# Patient Record
Sex: Female | Born: 1995 | Hispanic: Yes | Marital: Married | Smoking: Never smoker
Health system: Southern US, Community
[De-identification: ages and names within clinical notes are randomized; demographics above are authoritative.]

## PROBLEM LIST (undated history)

## (undated) ENCOUNTER — Inpatient Hospital Stay: Payer: Self-pay

## (undated) DIAGNOSIS — F419 Anxiety disorder, unspecified: Secondary | ICD-10-CM

## (undated) HISTORY — PX: NO PAST SURGERIES: SHX2092

## (undated) HISTORY — DX: Anxiety disorder, unspecified: F41.9

---
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
## 2016-06-12 ENCOUNTER — Emergency Department
Admission: EM | Admit: 2016-06-12 | Discharge: 2016-06-13 | Disposition: A | Discharge status: Home / Self Care | Payer: Self-pay | Attending: Emergency Medicine | Admitting: Emergency Medicine

## 2016-06-12 ENCOUNTER — Encounter: Payer: Self-pay | Admitting: Emergency Medicine

## 2016-06-12 DIAGNOSIS — R109 Unspecified abdominal pain: Secondary | ICD-10-CM

## 2016-06-12 DIAGNOSIS — I880 Nonspecific mesenteric lymphadenitis: Secondary | ICD-10-CM | POA: Insufficient documentation

## 2016-06-12 LAB — CBC
HEMATOCRIT: 37.1 % (ref 35.0–47.0)
Hemoglobin: 12.7 g/dL (ref 12.0–16.0)
MCH: 28 pg (ref 26.0–34.0)
MCHC: 34.3 g/dL (ref 32.0–36.0)
MCV: 81.7 fL (ref 80.0–100.0)
PLATELETS: 283 10*3/uL (ref 150–440)
RBC: 4.54 MIL/uL (ref 3.80–5.20)
RDW: 14.2 % (ref 11.5–14.5)
WBC: 8 10*3/uL (ref 3.6–11.0)

## 2016-06-12 LAB — BASIC METABOLIC PANEL
Anion gap: 7 (ref 5–15)
BUN: 13 mg/dL (ref 6–20)
CO2: 26 mmol/L (ref 22–32)
Calcium: 8.9 mg/dL (ref 8.9–10.3)
Chloride: 106 mmol/L (ref 101–111)
Creatinine, Ser: 0.61 mg/dL (ref 0.44–1.00)
GFR calc Af Amer: >60 mL/min (ref >60)
GLUCOSE: 121 mg/dL — AB (ref 65–99)
POTASSIUM: 3.7 mmol/L (ref 3.5–5.1)
Sodium: 139 mmol/L (ref 135–145)

## 2016-06-12 NOTE — ED Triage Notes (Signed)
Patient ambulatory to triage with steady gait, without difficulty or distress noted; pt reports right flank/side pain x week with no accomp symptoms; denies hx of same

## 2016-06-13 ENCOUNTER — Emergency Department: Payer: Self-pay

## 2016-06-13 LAB — URINALYSIS COMPLETE WITH MICROSCOPIC (ARMC ONLY)
Bilirubin Urine: NEGATIVE
GLUCOSE, UA: NEGATIVE mg/dL
Ketones, ur: NEGATIVE mg/dL
LEUKOCYTES UA: NEGATIVE
NITRITE: NEGATIVE
PH: 6 (ref 5.0–8.0)
PROTEIN: NEGATIVE mg/dL
SPECIFIC GRAVITY, URINE: 1.023 (ref 1.005–1.030)

## 2016-06-13 LAB — PREGNANCY, URINE: PREG TEST UR: NEGATIVE

## 2016-06-13 NOTE — ED Notes (Signed)
[name] RN called the lab to confirm that they received the Pt's urine sample. Sample was received and a pregnancy test urine was added.

## 2016-06-13 NOTE — ED Provider Notes (Signed)
[facility] Emergency Department Provider Note   ____________________________________________    I have reviewed the triage vital signs and the nursing notes.   HISTORY  Chief Complaint Flank Pain     HPI Kristen Romero is a 20 y.o. female who presents with complaints of right flank pain. Patient reports she has had flank pain over the last 2 days although it has resolved just prior to arrival. She denies nausea or vomiting. She denies a history of kidney stones. She is being treated for vaginal bleeding by her gynecologist but reports that is improved. She denies dysuria. No fevers or chills.   History reviewed. No pertinent past medical history.  There are no active problems to display for this patient.   History reviewed. No pertinent surgical history.  Prior to Admission medications   Not on File     Allergies Patient has no known allergies.  No family history on file.  Social History Social History  Substance Use Topics  . Smoking status: Never Smoker  . Smokeless tobacco: Never Used  . Alcohol use No    Review of Systems  Constitutional: No fever/chills  Cardiovascular: Denies chest pain. Respiratory: Denies shortness of breath. Gastrointestinal: Mild aching flank pain now resolved Genitourinary: Negative for dysuria. Musculoskeletal: Negative for back pain. Skin: Negative for rash. Neurological: Negative for headaches  10-point ROS otherwise negative.  ____________________________________________   PHYSICAL EXAM:  VITAL SIGNS: ED Triage Vitals  Enc Vitals Group     BP 06/12/16 2225 131/83     Pulse Rate 06/12/16 2225 93     Resp 06/12/16 2225 18     Temp 06/12/16 2225 98.6 F (37 C)     Temp Source 06/12/16 2225 Oral     SpO2 06/12/16 2225 99 %     Weight 06/12/16 2223 180 lb (81.6 kg)     Height 06/12/16 2223 5\' 2"  (1.575 m)     Head Circumference --      Peak Flow --      Pain Score 06/12/16 2223 4   Pain Loc --      Pain Edu? --      Excl. in GC? --     Constitutional: Alert and oriented. No acute distress. Pleasant and interactive Eyes: Conjunctivae are normal.   Nose: No congestion/rhinnorhea. Mouth/Throat: Mucous membranes are moist.    Cardiovascular: Normal rate, regular rhythm. Grossly normal heart sounds.  Good peripheral circulation. Respiratory: Normal respiratory effort.  No retractions. Lungs CTAB. Gastrointestinal: Soft and nontender. No distention.  No CVA tenderness. Genitourinary: deferred Musculoskeletal: No lower extremity tenderness nor edema.  Warm and well perfused Neurologic:  Normal speech and language. No gross focal neurologic deficits are appreciated.  Skin:  Skin is warm, dry and intact. No rash noted. Psychiatric: Mood and affect are normal. Speech and behavior are normal.  ____________________________________________   LABS (all labs ordered are listed, but only abnormal results are displayed)  Labs Reviewed  URINALYSIS COMPLETEWITH MICROSCOPIC ([facility] ONLY) - Abnormal; Notable for the following:       Result Value   Color, Urine YELLOW (*)    APPearance CLEAR (*)    Hgb urine dipstick 2+ (*)    Bacteria, UA RARE (*)    Squamous Epithelial / LPF 0-5 (*)    All other components within normal limits  BASIC METABOLIC PANEL - Abnormal; Notable for the following:    Glucose, Bld 121 (*)    All other components within normal limits  CBC  PREGNANCY, URINE   ____________________________________________  EKG  None ____________________________________________  RADIOLOGY  CT renal stone study unremarkable ____________________________________________   PROCEDURES  Procedure(s) performed: No    Critical Care performed: No ____________________________________________   INITIAL IMPRESSION / ASSESSMENT AND PLAN / ED COURSE  Pertinent labs & imaging results that were available during my care of the patient were reviewed by me and  considered in my medical decision making (see chart for details).  Patient well-appearing and in no distress Pain has resolved without intervention. Her exam is benign. Urinalysis is reassuring, CT scan does not show renal stone, she may have mesenteric adenitis which could be the cause of her pain. Recommend supportive care. Return precautions discussed  Clinical Course    ____________________________________________   FINAL CLINICAL IMPRESSION(S) / ED DIAGNOSES  Final diagnoses:  Right flank pain  Mesenteric adenitis      NEW MEDICATIONS STARTED DURING THIS VISIT:  There are no discharge medications for this patient.    Note:  This document was prepared using Dragon voice recognition software and may include unintentional dictation errors.    Jene Everyobert Kendyl Bissonnette, MD 06/13/16 [phone]

## 2016-06-26 ENCOUNTER — Emergency Department
Admission: EM | Admit: 2016-06-26 | Discharge: 2016-06-27 | Disposition: A | Discharge status: Home / Self Care | Payer: Self-pay | Attending: Emergency Medicine | Admitting: Emergency Medicine

## 2016-06-26 ENCOUNTER — Encounter: Payer: Self-pay | Admitting: Emergency Medicine

## 2016-06-26 DIAGNOSIS — N938 Other specified abnormal uterine and vaginal bleeding: Secondary | ICD-10-CM | POA: Insufficient documentation

## 2016-06-26 DIAGNOSIS — N939 Abnormal uterine and vaginal bleeding, unspecified: Secondary | ICD-10-CM

## 2016-06-26 LAB — URINALYSIS COMPLETE WITH MICROSCOPIC (ARMC ONLY)
Bacteria, UA: NONE SEEN
Bilirubin Urine: NEGATIVE
Glucose, UA: NEGATIVE mg/dL
Hgb urine dipstick: NEGATIVE
KETONES UR: NEGATIVE mg/dL
Leukocytes, UA: NEGATIVE
NITRITE: NEGATIVE
PROTEIN: NEGATIVE mg/dL
Specific Gravity, Urine: 1.021 (ref 1.005–1.030)
pH: 7 (ref 5.0–8.0)

## 2016-06-26 LAB — POCT PREGNANCY, URINE: Preg Test, Ur: NEGATIVE

## 2016-06-26 NOTE — ED Triage Notes (Signed)
PT presents to ED with c/o vaginal spotting "for over a month"; light pink in color. Pt states she only notices the blood when wiping and no pad/tampon has been needed. Seen by pcp and told to take birth control to help with the bleeding but symptoms have not resolved. denies any other complaints at this time.

## 2016-06-27 ENCOUNTER — Emergency Department: Payer: Self-pay

## 2016-06-27 LAB — WET PREP, GENITAL
Clue Cells Wet Prep HPF POC: NONE SEEN
SPERM: NONE SEEN
TRICH WET PREP: NONE SEEN
YEAST WET PREP: NONE SEEN

## 2016-06-27 LAB — CHLAMYDIA/NGC RT PCR (ARMC ONLY)
Chlamydia Tr: NOT DETECTED
N GONORRHOEAE: NOT DETECTED

## 2016-06-27 NOTE — Discharge Instructions (Signed)
Please follow up with OB/GYN as you have scheduled next week Monday.

## 2016-06-27 NOTE — ED Notes (Signed)
Pt returned to ED Rm 25 from US at this time.

## 2016-06-27 NOTE — ED Provider Notes (Signed)
[facility] Emergency Department Provider Note   ____________________________________________   First MD Initiated Contact with Patient 06/26/16 2348     (approximate)  I have reviewed the triage vital signs and the nursing notes.   HISTORY  Chief Complaint Vaginal Bleeding    HPI Roxanna Hessie Dienerlejo is a 20 y.o. female who comes into the hospital today with vaginal bleeding for more than a month. She reports that it is not very heavy and she only sees that when she wipes. The patient reports that she saw her doctor and was given birth control about a month ago but she still continued spotting. She reports that when she spoke to her doctor again they didn't know what was going on and told her she needs to see a specialist. The patient reports that she has an appointment with a specialist next Monday but came in today because the bleeding seemed a little bit heavier. The patient reports that the bleeding is not so heavy that she needs to use pads or tampons but she states it concerned her. The patient's last normal period was 4 months ago and she has not had a normal period since then. She is unsure if there is something that she should be worried about so she decided to come into the hospital. She stopped taking her oral birth control 2 days ago. The patient has no abdominal pain currently. She is here for evaluation.   History reviewed. No pertinent past medical history.  There are no active problems to display for this patient.   History reviewed. No pertinent surgical history.  Prior to Admission medications   Not on File    Allergies Patient has no known allergies.  No family history on file.  Social History Social History  Substance Use Topics  . Smoking status: Never Smoker  . Smokeless tobacco: Never Used  . Alcohol use Yes    Review of Systems Constitutional: No fever/chills Eyes: No visual changes. ENT: No sore throat. Cardiovascular:  Denies chest pain. Respiratory: Denies shortness of breath. Gastrointestinal: No abdominal pain.  No nausea, no vomiting.  No diarrhea.  No constipation. Genitourinary: vaginal bleeding Musculoskeletal: Negative for back pain. Skin: Negative for rash. Neurological: Negative for headaches, focal weakness or numbness.  10-point ROS otherwise negative.  ____________________________________________   PHYSICAL EXAM:  VITAL SIGNS: ED Triage Vitals  Enc Vitals Group     BP 06/26/16 2106 122/73     Pulse Rate 06/26/16 2106 89     Resp 06/26/16 2106 18     Temp 06/26/16 2106 98.5 F (36.9 C)     Temp Source 06/26/16 2106 Oral     SpO2 06/26/16 2106 99 %     Weight 06/26/16 2107 180 lb (81.6 kg)     Height 06/26/16 2107 5\' 2"  (1.575 m)     Head Circumference --      Peak Flow --      Pain Score 06/26/16 2107 0     Pain Loc --      Pain Edu? --      Excl. in GC? --     Constitutional: Alert and oriented. Well appearing and in no acute distress. Eyes: Conjunctivae are normal. PERRL. EOMI. Head: Atraumatic. Nose: No congestion/rhinnorhea. Mouth/Throat: Mucous membranes are moist.  Oropharynx non-erythematous. Cardiovascular: Normal rate, regular rhythm. Grossly normal heart sounds.  Good peripheral circulation. Respiratory: Normal respiratory effort.  No retractions. Lungs CTAB. Gastrointestinal: Soft and nontender. No distention. Positive bowel sounds Genitourinary:  The patient has some mild bloody discharge that some mucus filled. Normal external genitalia. No cervical motion tenderness or uterine or adnexal tenderness. Cervix is closed. No discharge noted. Musculoskeletal: No lower extremity tenderness nor edema.   Neurologic:  Normal speech and language.  Skin:  Skin is warm, dry and intact.  Psychiatric: Mood and affect are normal.   ____________________________________________   LABS (all labs ordered are listed, but only abnormal results are displayed)  Labs Reviewed    WET PREP, GENITAL - Abnormal; Notable for the following:       Result Value   WBC, Wet Prep HPF POC MODERATE (*)    All other components within normal limits  URINALYSIS COMPLETEWITH MICROSCOPIC ([facility] ONLY) - Abnormal; Notable for the following:    Color, Urine YELLOW (*)    APPearance CLOUDY (*)    Squamous Epithelial / LPF 0-5 (*)    All other components within normal limits  CHLAMYDIA/NGC RT PCR ([facility] ONLY)  POC URINE PREG, ED  POCT PREGNANCY, URINE   ____________________________________________  EKG  none ____________________________________________  RADIOLOGY  Pelvic ultrasound ____________________________________________   PROCEDURES  Procedure(s) performed: None  Procedures  Critical Care performed: No  ____________________________________________   INITIAL IMPRESSION / ASSESSMENT AND PLAN / ED COURSE  Pertinent labs & imaging results that were available during my care of the patient were reviewed by me and considered in my medical decision making (see chart for details).  This is a 20 year old female who comes into the hospital today with a month of vaginal spotting. She is here today for evaluation the patient had a wet prep performed that was unremarkable. I will send the patient for an ultrasound and then reassess the patient.  Clinical Course as of Jun 27 209  Tue Jun 27, 2016  0207 1. Normal ultrasound appearance of the ovaries. 2. Endometrial stripe thickness of 14.5 mm but without focal abnormality. Correlate with phase of menses.   US Pelvis Complete [AW]    Clinical Course User Index [AW] Rebecka ApleyAllison P Webster, MD    The patient's ultrasound is unremarkable. She'll be discharged home to follow-up with OB/GYN as she has scheduled next week Monday. ____________________________________________   FINAL CLINICAL IMPRESSION(S) / ED DIAGNOSES  Final diagnoses:  Vaginal bleeding  Dysfunctional uterine bleeding      NEW  MEDICATIONS STARTED DURING THIS VISIT:  New Prescriptions   No medications on file     Note:  This document was prepared using Dragon voice recognition software and may include unintentional dictation errors.    Rebecka ApleyAllison P Webster, MD 06/27/16 [phone]

## 2017-10-07 ENCOUNTER — Other Ambulatory Visit: Payer: Self-pay

## 2017-10-07 ENCOUNTER — Emergency Department
Admission: EM | Admit: 2017-10-07 | Discharge: 2017-10-07 | Disposition: A | Discharge status: Home / Self Care | Payer: 59 | Attending: Emergency Medicine | Admitting: Emergency Medicine

## 2017-10-07 ENCOUNTER — Emergency Department: Payer: 59

## 2017-10-07 DIAGNOSIS — K5900 Constipation, unspecified: Secondary | ICD-10-CM | POA: Insufficient documentation

## 2017-10-07 LAB — POCT PREGNANCY, URINE: Preg Test, Ur: NEGATIVE

## 2017-10-07 NOTE — ED Notes (Addendum)
Pt c/o difficulty having a BM x3 weeks. Pt reports seeing her PCP 2 weeks ago and given Miralax. Pt reports it causing her to have diarrhea so she stopped taking it. Pt reports having to strain to have a BM. Her last BM was this am and states it was "soft". Pt denies any n/v.

## 2017-10-07 NOTE — ED Triage Notes (Signed)
Pt  States "having trouble with bowel movements." states last BM was this AM, states was soft. Saw PCP and was given stool softener. States not having them as often and having to strain. Alert, oriented, ambulatory. No distress noted.

## 2017-10-07 NOTE — ED Provider Notes (Signed)
[facility] Emergency Department Provider Note  ____________________________________________   First MD Initiated Contact with Patient 10/07/17 1150     (approximate)  I have reviewed the triage vital signs and the nursing notes.   HISTORY  Chief Complaint Constipation    HPI Kristen CourseDarleen Romero is a 22 y.o. female presents to the emergency department because she has been having trouble with her bowel movements for over 3 weeks.  She saw her PCP 2 weeks ago and was given MiraLAX.  This caused her to have diarrhea.  So she stopped taking the medication.  She states that her last BM was soft and had small pieces.  She states it does float on the top quite a bit.  She got on [employer] and got really concerned that she might have an appendicitis or blockage.  She denies fever or chills.  She denies complaints vomiting.  She denies any urinary tract symptoms.  She does not have a history of IBS.  History reviewed. No pertinent past medical history.  There are no active problems to display for this patient.   History reviewed. No pertinent surgical history.  Prior to Admission medications   Not on File    Allergies Patient has no known allergies.  History reviewed. No pertinent family history.  Social History Social History   Tobacco Use  . Smoking status: Never Smoker  . Smokeless tobacco: Never Used  Substance Use Topics  . Alcohol use: Yes  . Drug use: No    Review of Systems  Constitutional: No fever/chills Eyes: No visual changes. ENT: No sore throat. Respiratory: Denies cough Abdomen: Complains of constipation Genitourinary: Negative for dysuria. Musculoskeletal: Negative for back pain. Skin: Negative for rash.    ____________________________________________   PHYSICAL EXAM:  VITAL SIGNS: ED Triage Vitals [10/07/17 1109]  Enc Vitals Group     BP 92/71     Pulse Rate 69     Resp 16     Temp 98.5 F (36.9 C)     Temp Source Oral       SpO2 100 %     Weight 183 lb (83 kg)     Height 5\' 2"  (1.575 m)     Head Circumference      Peak Flow      Pain Score 3     Pain Loc      Pain Edu?      Excl. in GC?     Constitutional: Alert and oriented. Well appearing and in no acute distress. Eyes: Conjunctivae are normal.  Head: Atraumatic. Nose: No congestion/rhinnorhea. Mouth/Throat: Mucous membranes are moist.   Cardiovascular: Normal rate, regular rhythm.  Heart sounds are normal Respiratory: Normal respiratory effort.  No retractions lungs are clear to auscultation Abdomen: Soft, nontender, bowel sounds normal all 4 quads GU: deferred Musculoskeletal: FROM all extremities, warm and well perfused Neurologic:  Normal speech and language.  Skin:  Skin is warm, dry and intact. No rash noted. Psychiatric: Mood and affect are normal. Speech and behavior are normal.  ____________________________________________   LABS (all labs ordered are listed, but only abnormal results are displayed)  Labs Reviewed  POCT PREGNANCY, URINE   ____________________________________________   ____________________________________________  RADIOLOGY  KUB  ____________________________________________   PROCEDURES  Procedure(s) performed: No  Procedures    ____________________________________________   INITIAL IMPRESSION / ASSESSMENT AND PLAN / ED COURSE  Pertinent labs & imaging results that were available during my care of the patient were reviewed by me  and considered in my medical decision making (see chart for details).  Patient is 22 year old female complaining of constipation.  She was seen by her primary care doctor 2 weeks ago.  She states she gave her MiraLAX and then she had diarrhea from MiraLAX.  Stop taking this medication and now has more problems with constipation.  She got on [employer] and is concerned that she has a blockage or appendicitis.  On physical exam she appears well and the abdomen is nontender  with good bowel sounds  KUB x-ray   KUB x-ray was normal.  Discussed x-ray results with patient.  She is to adjust her diet and lower the dose of the MiraLAX if it gives her diarrhea.  Explained to her that I think she is being a little too focused on her bowel habits at this time.  That she should relax and that everything will return to normal.  If she is worsening she should return the emergency department or see her regular doctor.  She states she understands will follow-up.  She was discharged in stable condition  As part of my medical decision making, I reviewed the following data within the electronic MEDICAL RECORD NUMBER Nursing notes reviewed and incorporated, Radiograph reviewed KUB x-ray is normal, Notes from prior ED visits and [place] Controlled Substance Database  ____________________________________________   FINAL CLINICAL IMPRESSION(S) / ED DIAGNOSES  Final diagnoses:  Constipation, unspecified constipation type      NEW MEDICATIONS STARTED DURING THIS VISIT:  There are no discharge medications for this patient.    Note:  This document was prepared using Dragon voice recognition software and may include unintentional dictation errors.    Faythe Ghee, PA-C 10/07/17 1552    Governor Rooks, MD 10/10/17 [phone]

## 2017-10-07 NOTE — Discharge Instructions (Signed)
Return to your normal diet.  Decrease the amount of MiraLAX.  Continue to eat fiber.  Drink plenty of water.  If you are worsening please return to the emergency department or your regular doctor.

## 2018-04-28 IMAGING — US US TRANSVAGINAL NON-OB
1 series · 14 of 25 positions shown · non-contrast
Comparison: None

CLINICAL DATA: Light vaginal spotting for 1 month irregular periods

EXAM:
TRANSABDOMINAL AND TRANSVAGINAL ULTRASOUND OF PELVIS
TECHNIQUE: Both transabdominal and transvaginal ultrasound examinations of the
pelvis were performed. Transabdominal technique was performed for
global imaging of the pelvis including uterus, ovaries, adnexal
regions, and pelvic cul-de-sac. It was necessary to proceed with
endovaginal exam following the transabdominal exam to visualize the
ovaries and endometrium.

[Series 1: us transvaginal non-ob · 0.25mm/px · 14 of 68 slices shown]
[im 1/68]
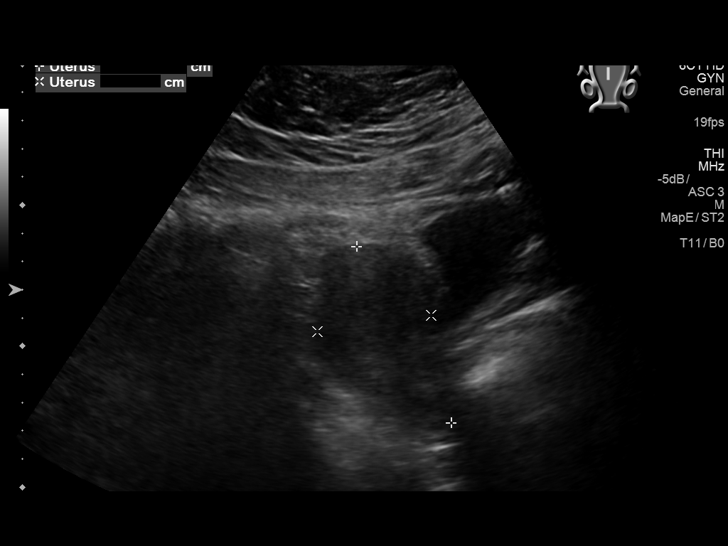
[im 6/68]
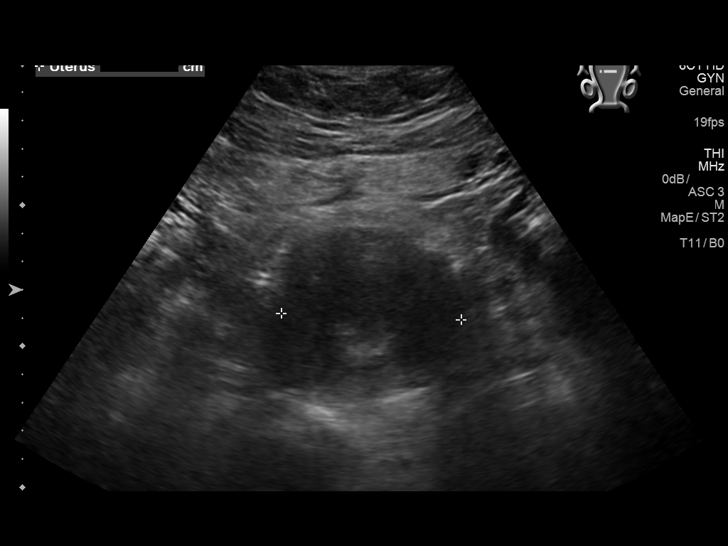
[im 12/68]
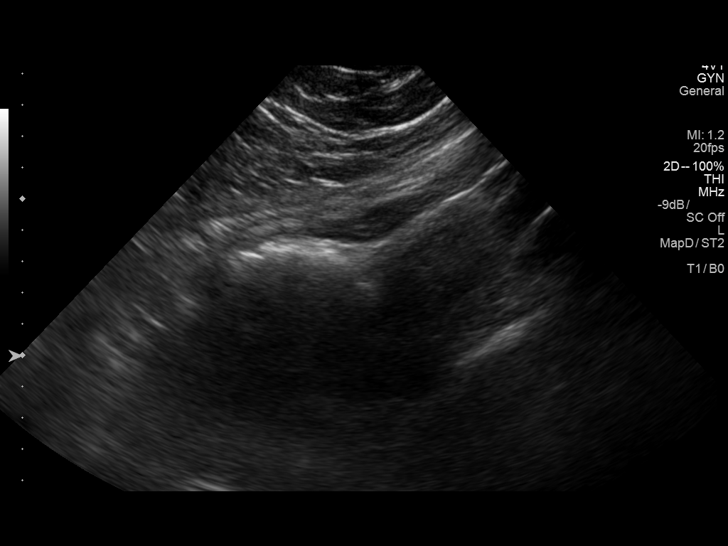
[im 17/68]
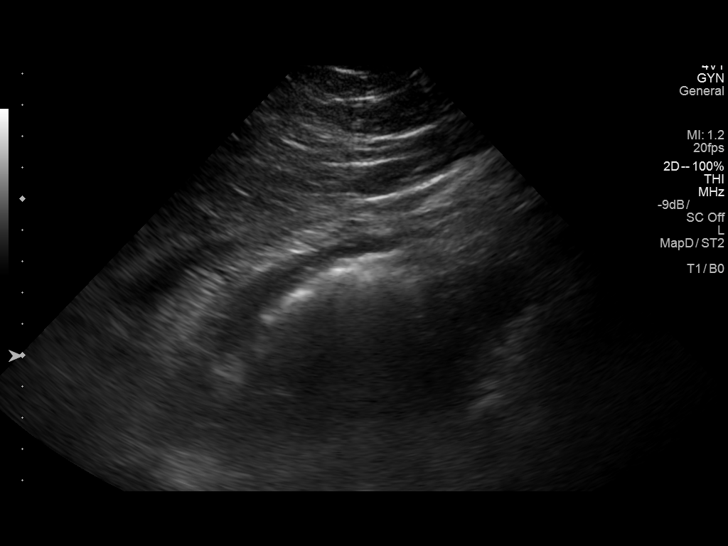
[im 23/68]
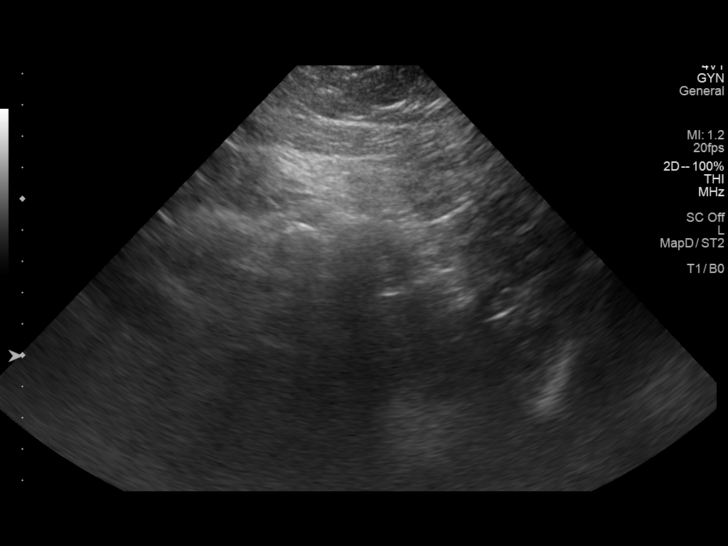
[im 26/68]
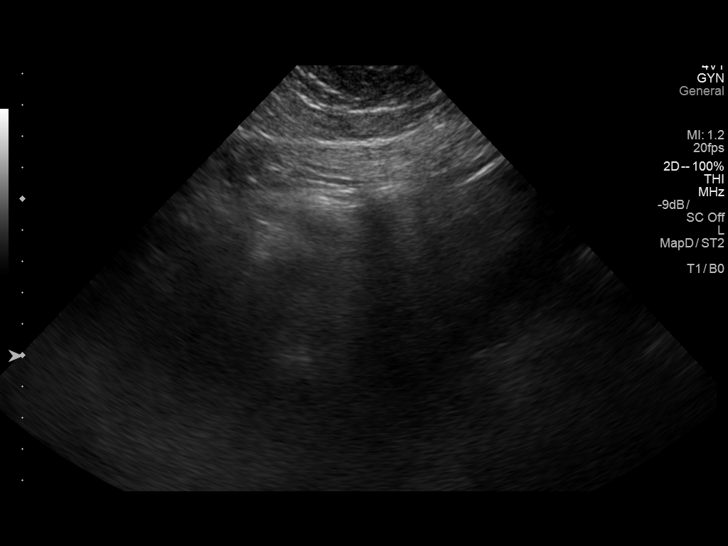
[im 31/68]
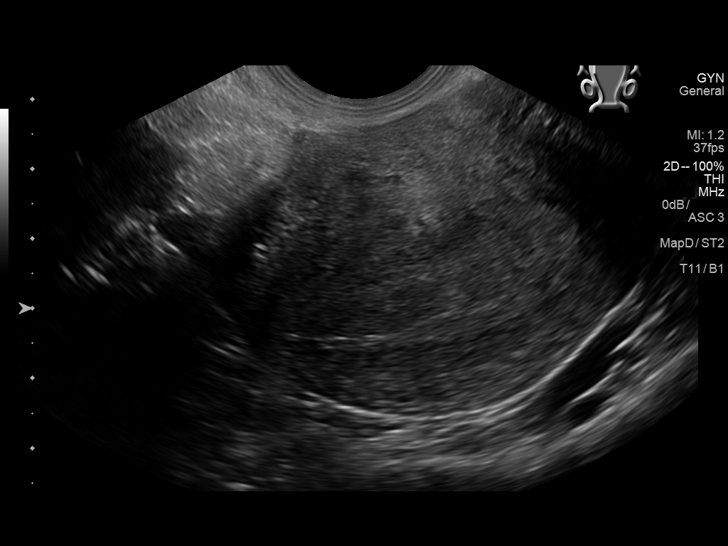
[im 37/68]
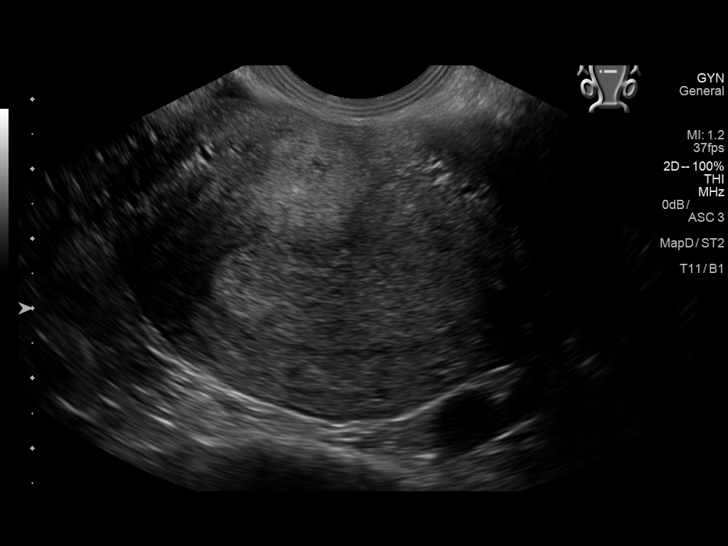
[im 42/68]
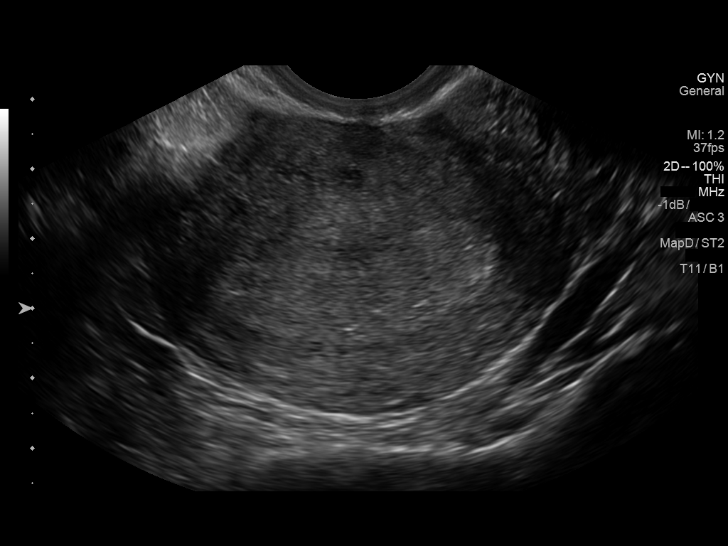
[im 45/68]
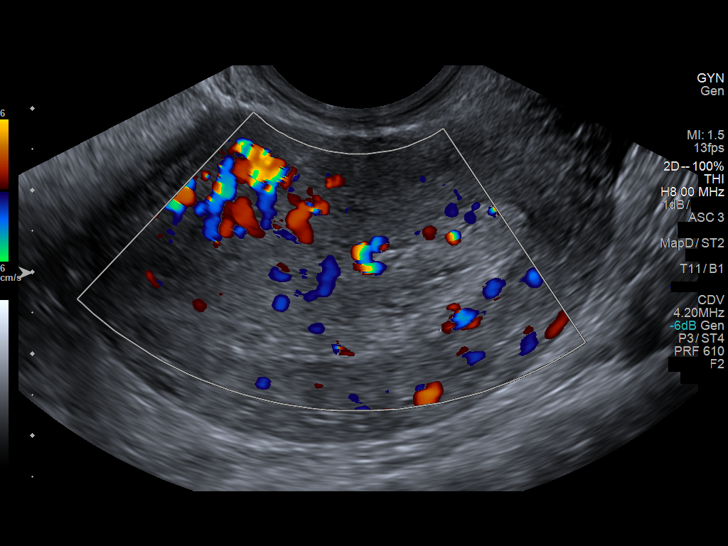
[im 51/68]
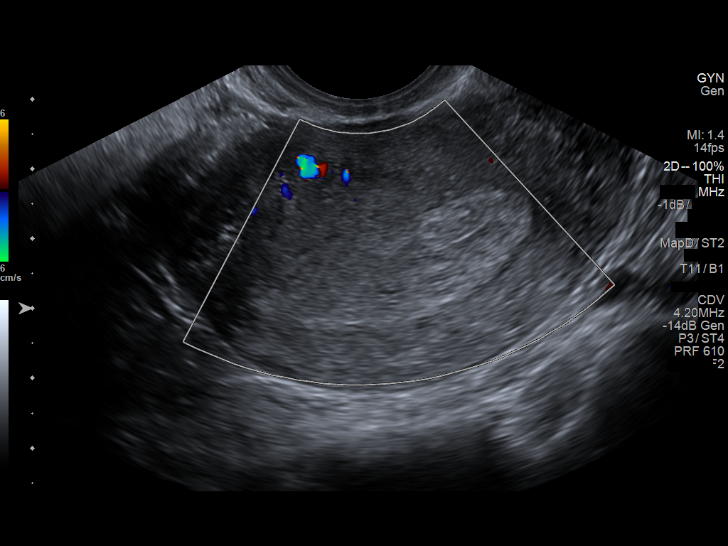
[im 56/68]
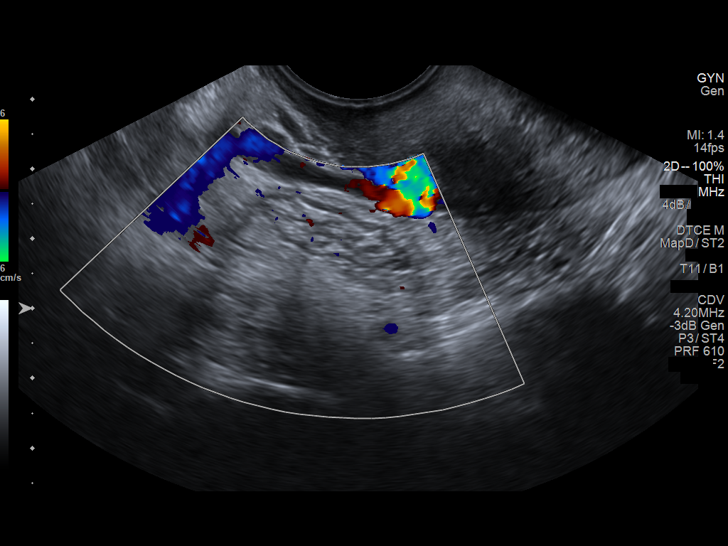
[im 62/68]
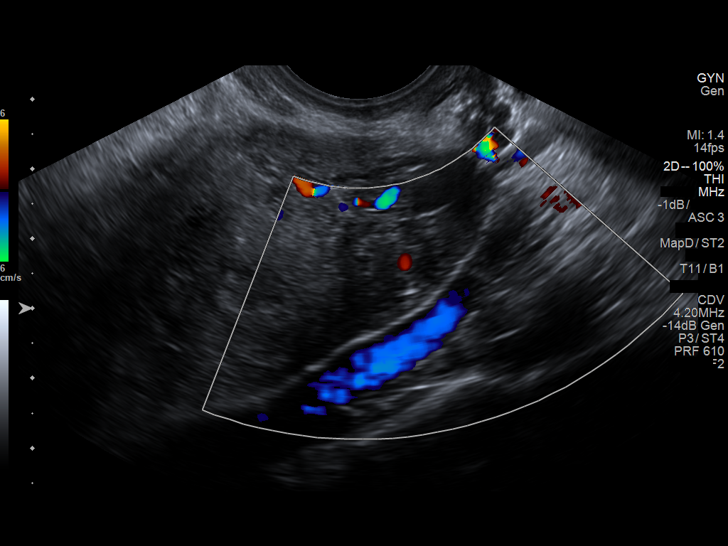
[im 68/68]
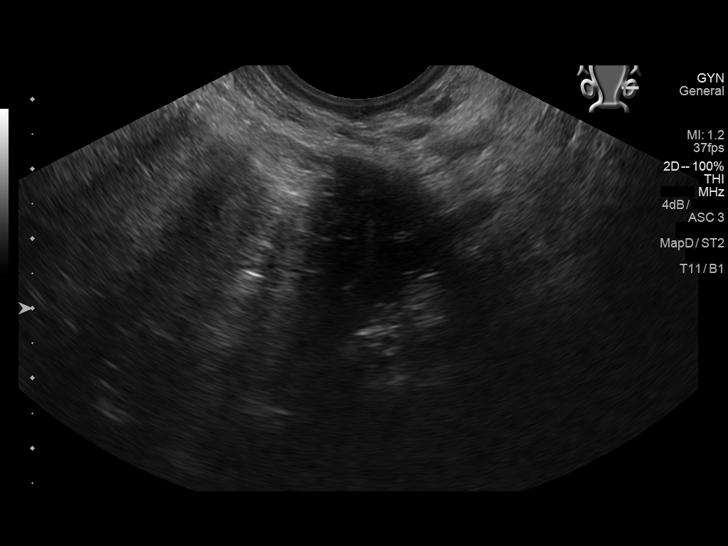

[14 of 25 positions shown; findings below may reference images not displayed]

FINDINGS: Uterus

Measurements: 6.6 x 4.2 x 5.8 cm. No fibroids or other mass
visualized.

Endometrium

Thickness: 14.5 mm.  No focal abnormality visualized.

Right ovary

Measurements: 2.7 x 1.5 x 2.1 cm. Normal appearance/no adnexal mass.

Left ovary

Measurements: 2.8 x 1.8 x 2.1 cm. Normal appearance/no adnexal mass.

Other findings

No abnormal free fluid.
IMPRESSION: 1. Normal ultrasound appearance of the ovaries.
2. Endometrial stripe thickness of 14.5 mm but without focal
abnormality. Correlate with phase of menses.

## 2018-07-29 IMAGING — CT CT RENAL STONE PROTOCOL
2 of 4 series · 17 of 46 positions shown, 19 images · non-contrast
Comparison: None.

CLINICAL DATA: Right flank pain for 1 week.

EXAM:
CT ABDOMEN AND PELVIS WITHOUT CONTRAST
TECHNIQUE: Multidetector CT imaging of the abdomen and pelvis was performed
following the standard protocol without IV contrast.

[Series 2: axial st · axial · 0.93mm/px · z∈[-370,+100]mm · 14 of 104 slices shown, 16 images]
[im 5/104  soft-tissue]
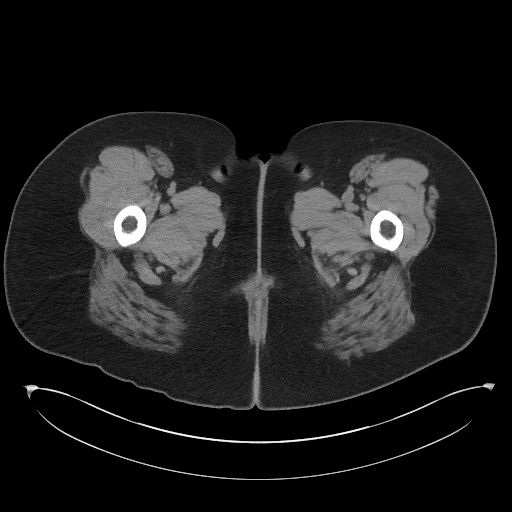
[im 5/104  bone]
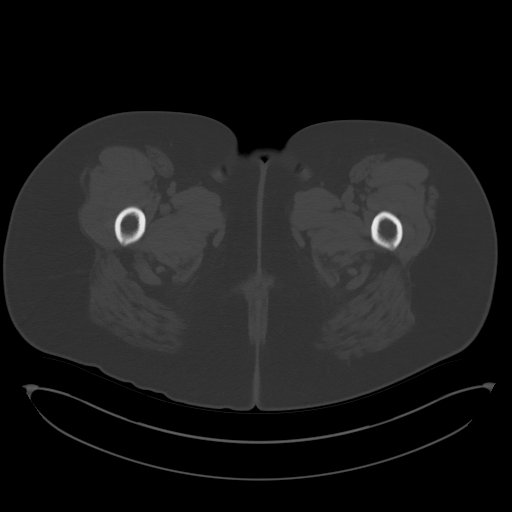
[im 13/104  soft-tissue]
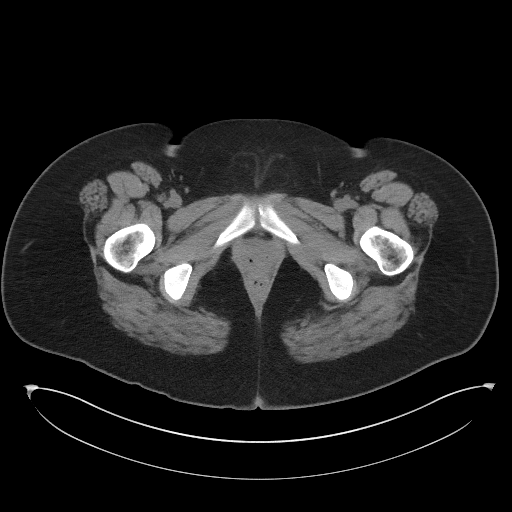
[im 22/104  soft-tissue]
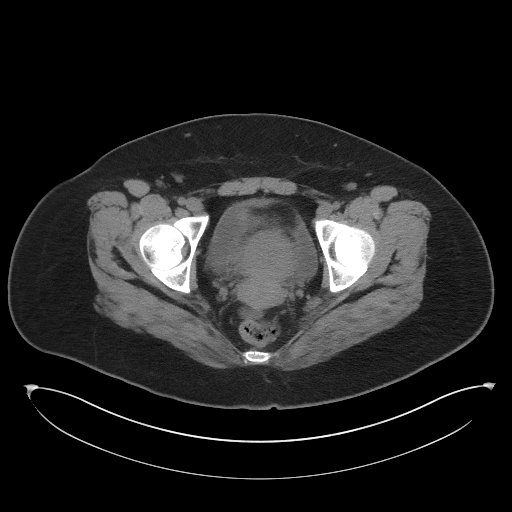
[im 26/104  soft-tissue]
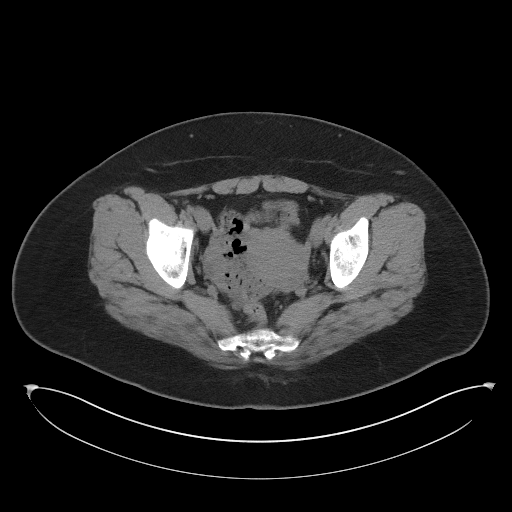
[im 35/104  soft-tissue]
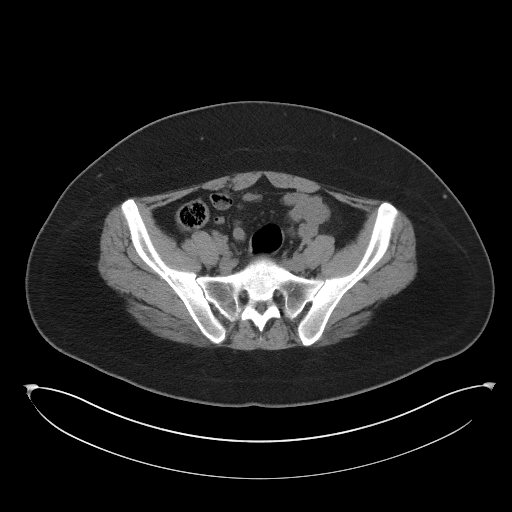
[im 43/104  soft-tissue]
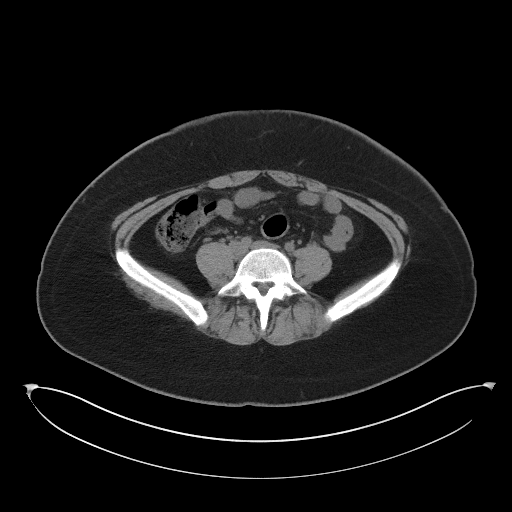
[im 48/104  soft-tissue]
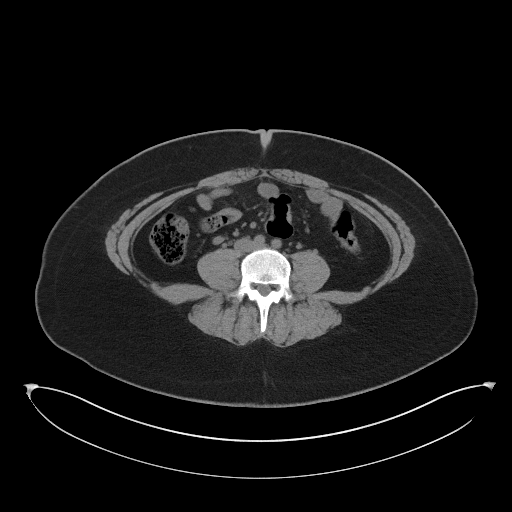
[im 56/104  soft-tissue]
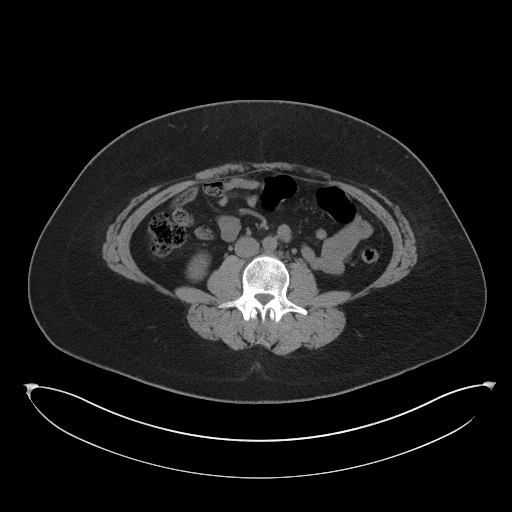
[im 61/104  soft-tissue]
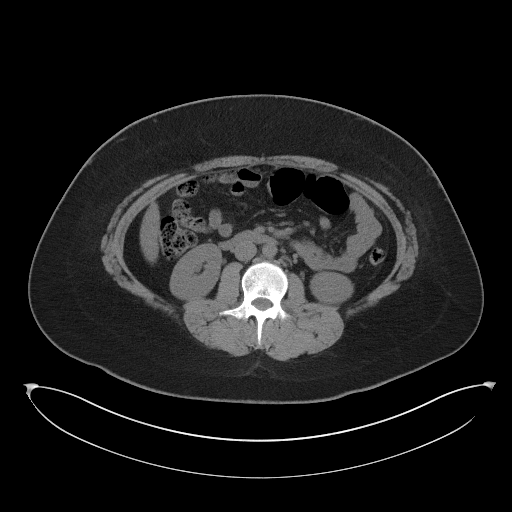
[im 61/104  bone]
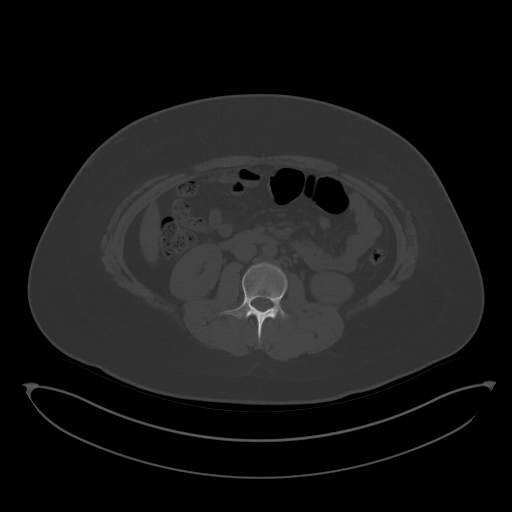
[im 69/104  soft-tissue]
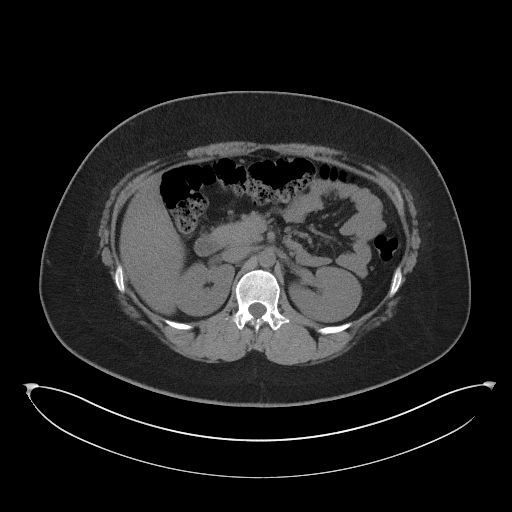
[im 78/104  soft-tissue]
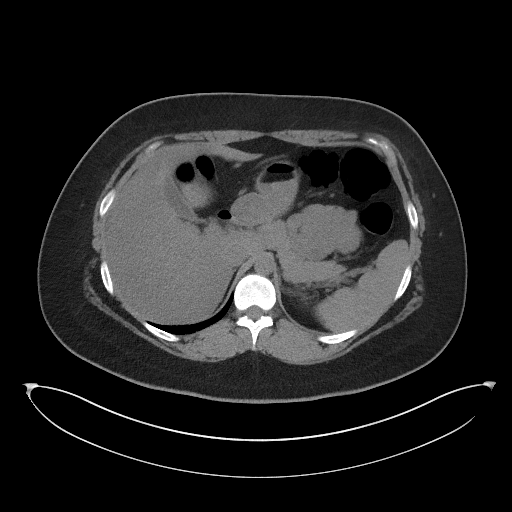
[im 82/104  soft-tissue]
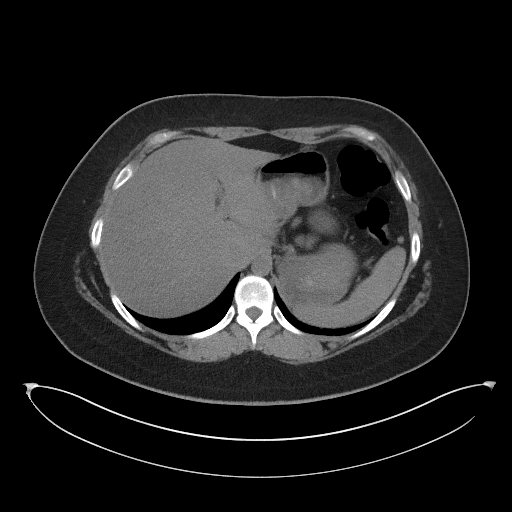
[im 91/104  soft-tissue]
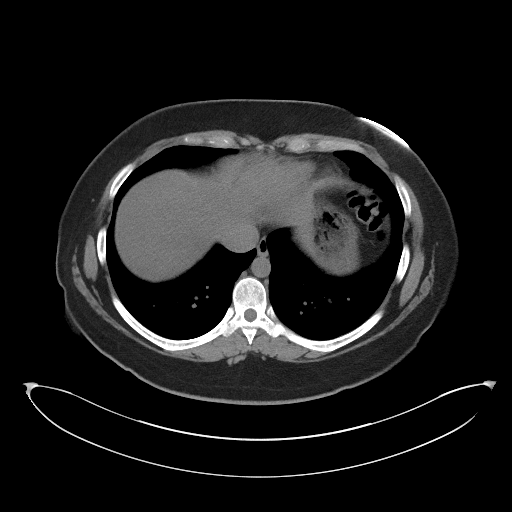
[im 99/104  soft-tissue]
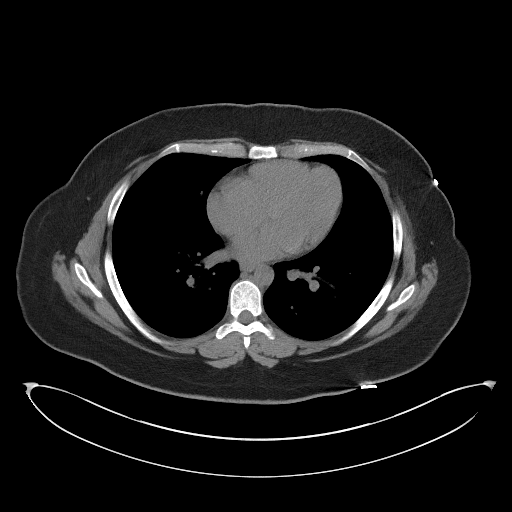

[Series 5: coronal · coronal · 0.91mm/px · 3 of 144 slices shown]
[im 48/144  soft-tissue]
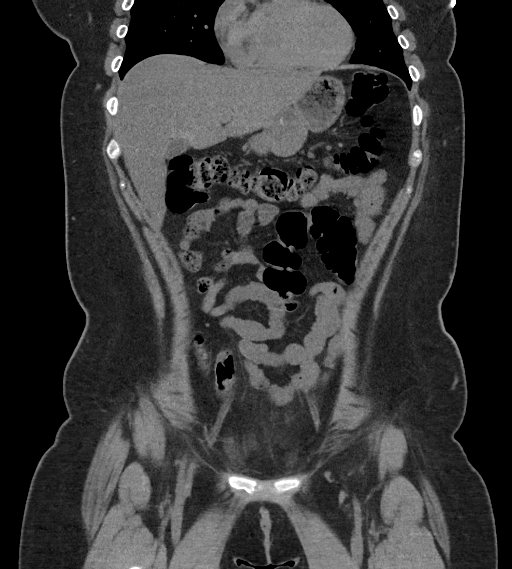
[im 64/144  soft-tissue]
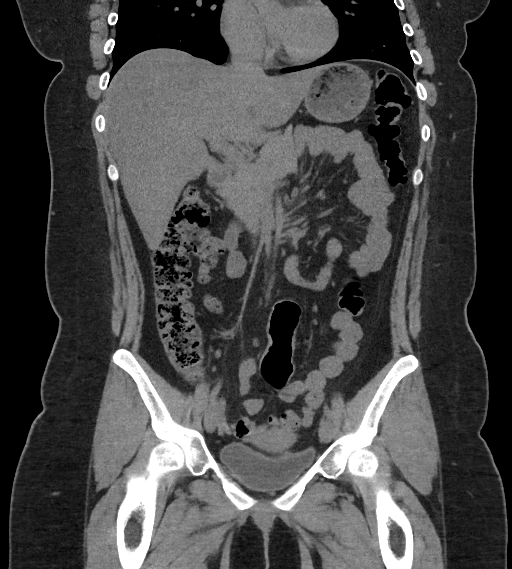
[im 80/144  soft-tissue]
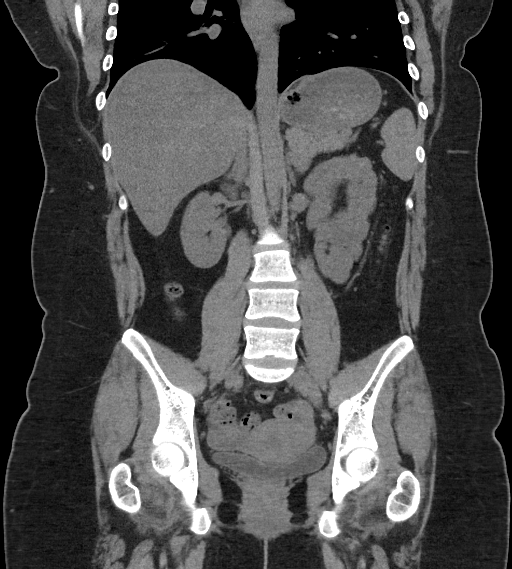

[17 of 46 positions shown; findings below may reference images not displayed]

FINDINGS: Lower chest: No acute abnormality.

Hepatobiliary: No focal liver abnormality is seen. No gallstones,
gallbladder wall thickening, or biliary dilatation.

Pancreas: Unremarkable. No pancreatic ductal dilatation or
surrounding inflammatory changes.

Spleen: Normal in size without focal abnormality.

Adrenals/Urinary Tract: Adrenal glands are unremarkable. Kidneys are
normal, without renal calculi, focal lesion, or hydronephrosis.
Bladder is unremarkable.

Stomach/Bowel: Stomach is within normal limits. Appendix appears
normal. No evidence of bowel wall thickening, distention, or
inflammatory changes.

Vascular/Lymphatic: The abdominal aorta is normal in caliber,
without calcification.

There are mildly prominent right lower quadrant mesenteric nodes.

Reproductive: Uterus and bilateral adnexa are unremarkable.

Other: No acute inflammatory changes are evident in the abdomen or
pelvis. There is no ascites.

Musculoskeletal: No acute or significant osseous findings.
IMPRESSION: 1. No urinary calculi.
2. Normal appendix
3. Mildly prominent right lower quadrant mesenteric nodes. This
could represent mesenteric adenitis.

## 2019-11-22 IMAGING — CR DG ABDOMEN 1V
1 series · 2 of 2 positions shown · non-contrast
Comparison: None.

CLINICAL DATA: Abdominal pain, constipation

EXAM:
ABDOMEN - 1 VIEW

[Series 1: dg abd 1 view · 0.14mm/px · 2 of 2 slices shown]
[im 1/2]
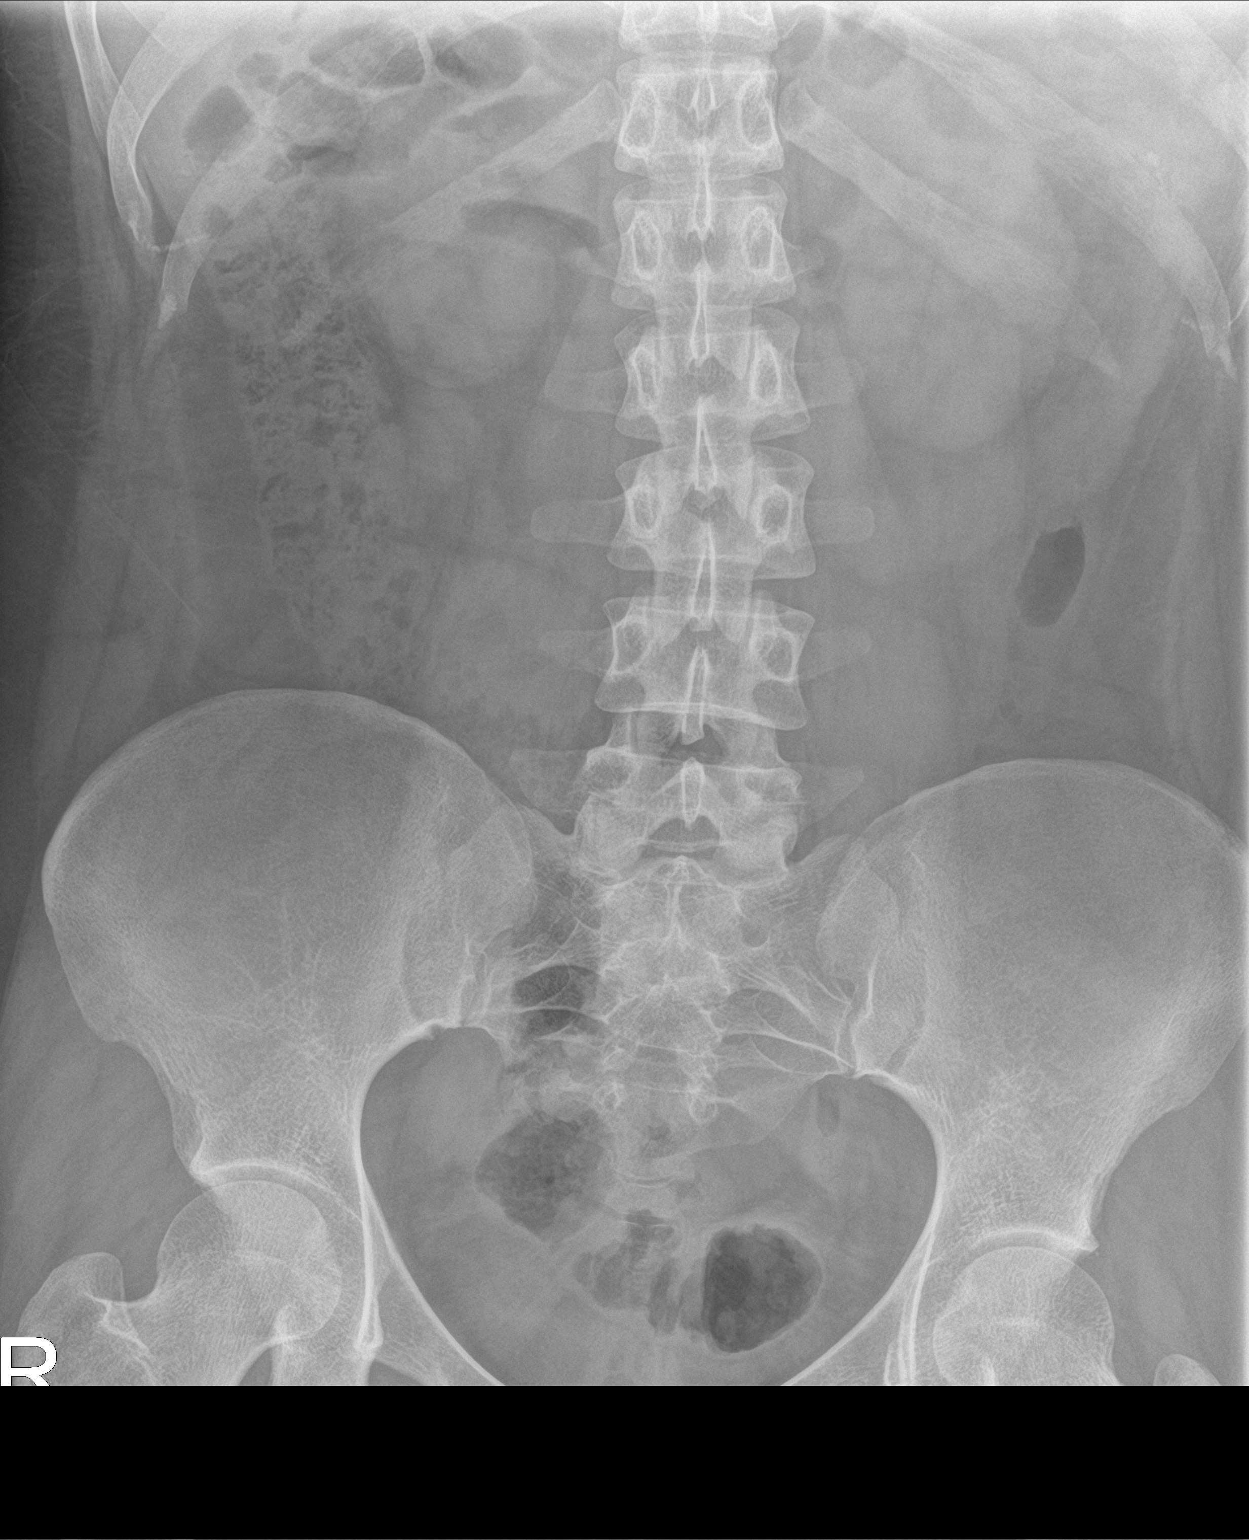
[im 2/2]
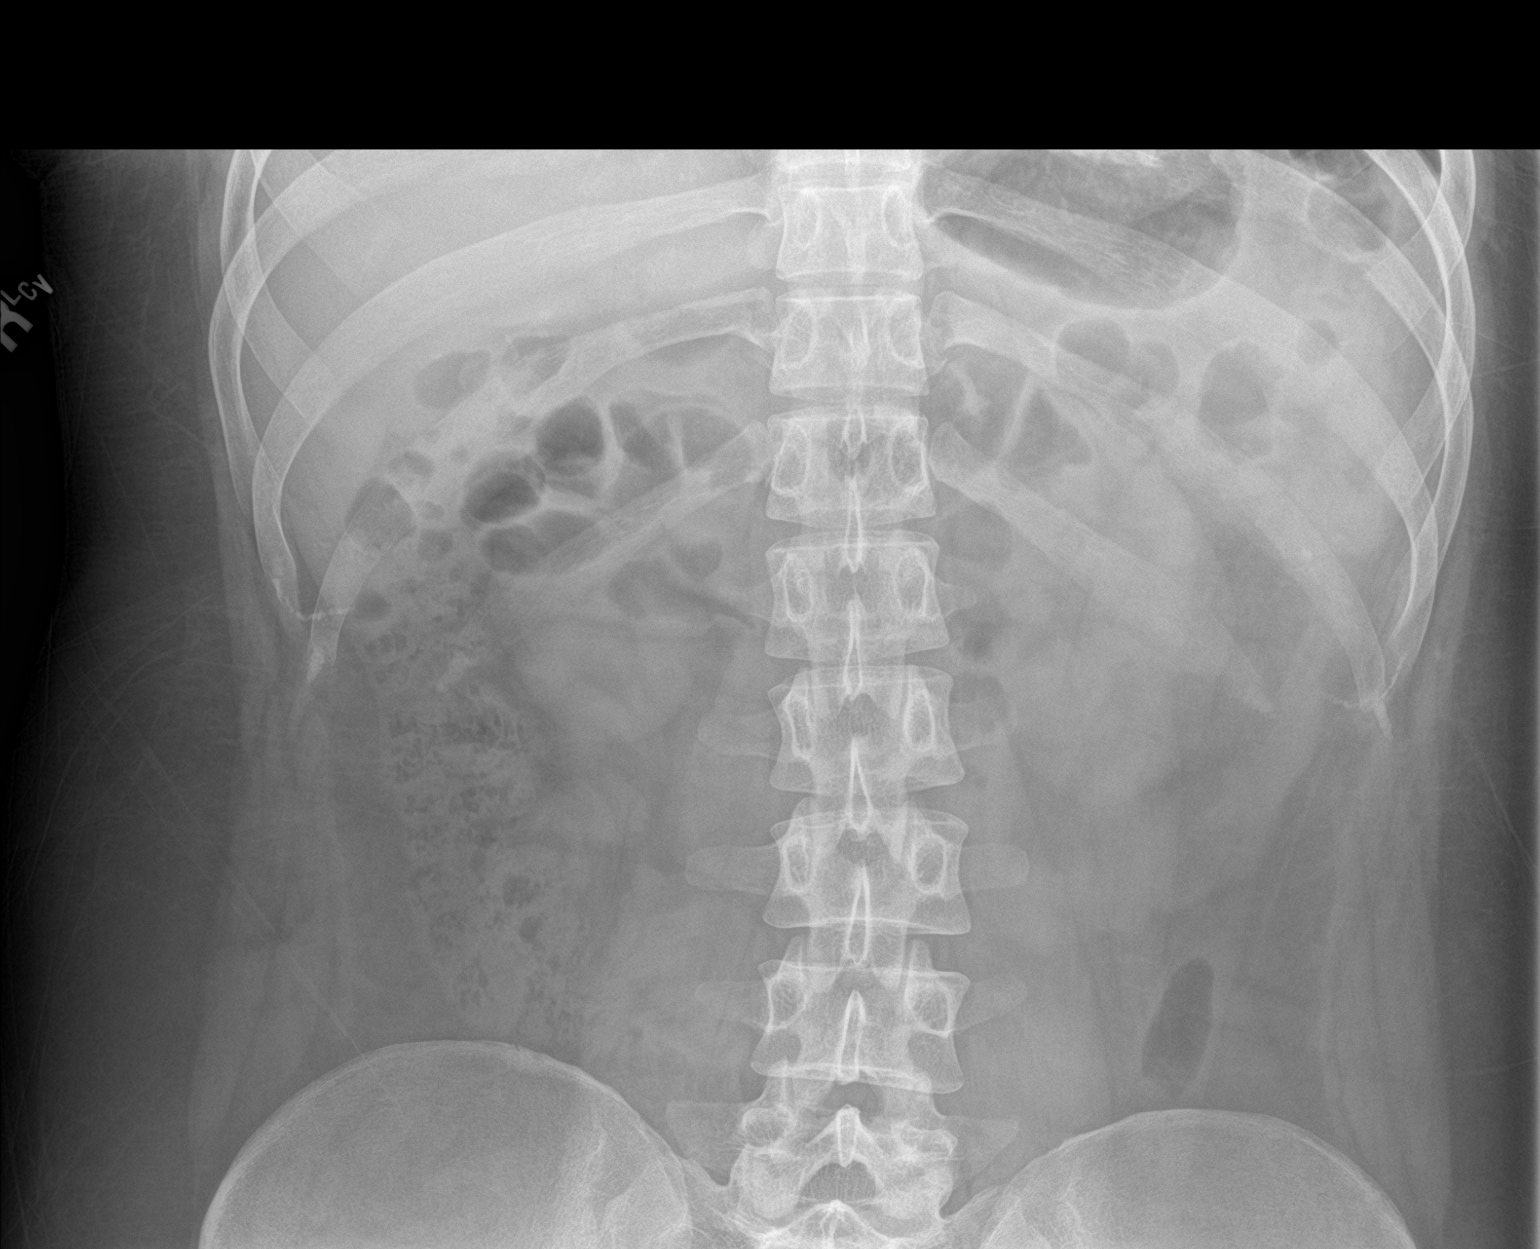

[2 of 2 positions shown; findings below may reference images not displayed]

FINDINGS: The bowel gas pattern is normal. No radio-opaque calculi or other
significant radiographic abnormality are seen.
IMPRESSION: Negative.

## 2022-07-20 ENCOUNTER — Ambulatory Visit
Admission: EM | Admit: 2022-07-20 | Discharge: 2022-07-20 | Disposition: A | Discharge status: Home / Self Care | Payer: BC Managed Care – PPO | Attending: Family Medicine | Admitting: Family Medicine

## 2022-07-20 ENCOUNTER — Ambulatory Visit (INDEPENDENT_AMBULATORY_CARE_PROVIDER_SITE_OTHER): Payer: BC Managed Care – PPO

## 2022-07-20 DIAGNOSIS — N926 Irregular menstruation, unspecified: Secondary | ICD-10-CM | POA: Diagnosis not present

## 2022-07-20 DIAGNOSIS — M79604 Pain in right leg: Secondary | ICD-10-CM | POA: Diagnosis not present

## 2022-07-20 LAB — PREGNANCY, URINE: Preg Test, Ur: NEGATIVE

## 2022-07-20 MED ORDER — METHOCARBAMOL 500 MG PO TABS: 500.0000 mg | ORAL_TABLET | Freq: Two times a day (BID) | ORAL | 0 refills | Status: DC

## 2022-07-20 MED ORDER — NAPROXEN 500 MG PO TABS: 500.0000 mg | ORAL_TABLET | Freq: Two times a day (BID) | ORAL | 0 refills | Status: DC

## 2022-07-20 NOTE — ED Provider Notes (Signed)
[facility]    CSN: [id number] Arrival date & time: 07/20/22  1542      History   Chief Complaint Chief Complaint  Patient presents with   Thigh pain    HPI  HPI Kristen Romero is a 26 y.o. female.   Kristen Romero presents for right thigh pain. She was in the gym lifting weighting while squatting and felt a pain described as achy and pulling sensation. No meds prior to arrival. No ice or heat. No trouble walking.  Has right knee pain as well. No abnormal pops or sounds. No previous injury to this leg.   She googled her symptoms and is concerned about ectopic pregnancy. Patient's last menstrual period was 06/14/2022. This period was normal flow and duration. Has not had her period this month.    History reviewed. No pertinent past medical history.  There are no problems to display for this patient.   History reviewed. No pertinent surgical history.  OB History   No obstetric history on file.      Home Medications    Prior to Admission medications   Medication Sig Start Date End Date Taking? Authorizing Provider  methocarbamol (ROBAXIN) 500 MG tablet Take 1 tablet (500 mg total) by mouth 2 (two) times daily. 07/20/22  Yes Payzlee Ryder, DO  naproxen (NAPROSYN) 500 MG tablet Take 1 tablet (500 mg total) by mouth 2 (two) times daily with a meal. 07/20/22  Yes Neleh Muldoon, DO  Prenatal Vit-Fe Fumarate-FA (PRENATAL PO) Take by mouth.   Yes [provider]    Family History History reviewed. No pertinent family history.  Social History Social History   Tobacco Use   Smoking status: Never   Smokeless tobacco: Never  Substance Use Topics   Alcohol use: Yes   Drug use: No     Allergies   Patient has no known allergies.   Review of Systems Review of Systems: :negative unless otherwise stated in HPI.      Physical Exam Triage Vital Signs ED Triage Vitals  Enc Vitals Group     BP 07/20/22 1814 133/81     Pulse Rate 07/20/22 1814 77      Resp 07/20/22 1814 16     Temp 07/20/22 1814 98.4 F (36.9 C)     Temp Source 07/20/22 1814 Oral     SpO2 07/20/22 1814 100 %     Weight 07/20/22 1812 210 lb (95.3 kg)     Height 07/20/22 1812 5\' 2"  (1.575 m)     Head Circumference --      Peak Flow --      Pain Score 07/20/22 1811 3     Pain Loc --      Pain Edu? --      Excl. in [place]? --    No data found.  Updated Vital Signs BP 133/81 (BP Location: Left Arm)   Pulse 77   Temp 98.4 F (36.9 C) (Oral)   Resp 16   Ht 5\' 2"  (1.575 m)   Wt 95.3 kg   LMP 06/14/2022 Comment: neg preg test 07/20/22  SpO2 100%   BMI 38.41 kg/m   Visual Acuity Right Eye Distance:   Left Eye Distance:   Bilateral Distance:    Right Eye Near:   Left Eye Near:    Bilateral Near:     Physical Exam GEN: well appearing female in no acute distress  CVS: well perfused  RESP: speaking in full sentences without pause, no respiratory  distress  MSK:  Hip, Right: TTP noted at anterior inferior iliac spine. No obvious rash, deformity, erythema, ecchymosis, or edema. Passive Log Roll equivalent b/l without restriction. ROM full in all directions; Strength 5/5 in IR/ER/Flex/Ext/Abd/Add. Pelvic alignment unremarkable to inspection and palpation. Non-antalgic gait without trendelenburg / unsteadiness. Greater trochanter without tenderness to palpation. No tenderness over piriformis. No SI joint tenderness and normal minimal SI movement.  Neurovascularly intact.   UC Treatments / Results  Labs (all labs ordered are listed, but only abnormal results are displayed) Labs Reviewed  PREGNANCY, URINE    EKG   Radiology DG Hip Unilat With Pelvis 2-3 Views Right  Result Date: 07/20/2022 CLINICAL DATA:  Hip pain EXAM: DG HIP (WITH OR WITHOUT PELVIS) 2-3V RIGHT COMPARISON:  None Available. FINDINGS: There is no evidence of hip fracture or dislocation. There is no evidence of arthropathy or other focal bone abnormality. IMPRESSION: Negative. Electronically  Signed   By: Donavan Foil M.D.   On: 07/20/2022 19:44    Procedures Procedures (including critical care time)  Medications Ordered in UC Medications - No data to display  Initial Impression / Assessment and Plan / UC Course  I have reviewed the triage vital signs and the nursing notes.  Pertinent labs & imaging results that were available during my care of the patient were reviewed by me and considered in my medical decision making (see chart for details).      Pt is a 26 y.o.  female with 1 day of right hip pain after squatting in the gym with weights.  She has tenderness over the anterior inferior iliac spine.  Obtained plain right hip and pelvis films that were unremarkable for avulsion fracture or dislocation.  Radiologist agrees.    Patient to gradually return to normal activities, as tolerated and continue ordinary activities within the limits permitted by pain. Prescribed Naproxen sodium  and muscle relaxer  for pain relief.  Tylenol PRN. Advised patient to avoid OTC NSAIDs while taking prescription NSAID. Counseled patient on red flag symptoms and when to seek immediate care.  No red flags such as progressive major motor weakness.   Patient to follow up with orthopedic provider, if symptoms do not improve with conservative treatment.    She was concerned that she may have an ectopic pregnancy after not getting her period this month.  Urine pregnancy test was negative.  On further review, she has a history of irregular periods.  Return and ED precautions given. Understanding voiced. Discussed MDM, treatment plan and plan for follow-up with patient who agrees with plan.   Final Clinical Impressions(s) / UC Diagnoses   Final diagnoses:  Pain of right lower extremity  Missed period     Discharge Instructions      If medication was prescribed, stop by the pharmacy to pick up your prescriptions.  For your  pain, Take 1000 mg Tylenol twice a day, take muscle relaxer (Robaxin)  twice a day, take Naprosyn twice a day,  as needed for pain. Apply cold compresses intermittently, as needed.  As pain recedes, begin normal activities slowly as tolerated.  Follow up with primary care provider or an orthopedic provider, if symptoms persist.  Watch for worsening symptoms such as an increasing weakness or loss of sensation, increasing pain and/or the loss of bladder or bowel function. Should any of these occur, go to the emergency department immediately.        ED Prescriptions     Medication Sig Dispense Auth. Provider  methocarbamol (ROBAXIN) 500 MG tablet Take 1 tablet (500 mg total) by mouth 2 (two) times daily. 20 tablet Julianne Chamberlin, DO   naproxen (NAPROSYN) 500 MG tablet Take 1 tablet (500 mg total) by mouth 2 (two) times daily with a meal. 30 tablet Denilson Salminen, DO      PDMP not reviewed this encounter.   Katha Cabal, DO 07/20/22 2015

## 2022-07-20 NOTE — Discharge Instructions (Signed)
If medication was prescribed, stop by the pharmacy to pick up your prescriptions.  For your  pain, Take 1000 mg Tylenol twice a day, take muscle relaxer (Robaxin) twice a day, take Naprosyn twice a day,  as needed for pain. Apply cold compresses intermittently, as needed.  As pain recedes, begin normal activities slowly as tolerated.  Follow up with primary care provider or an orthopedic provider, if symptoms persist.  Watch for worsening symptoms such as an increasing weakness or loss of sensation, increasing pain and/or the loss of bladder or bowel function. Should any of these occur, go to the emergency department immediately.

## 2022-07-20 NOTE — ED Triage Notes (Addendum)
Pt c/o R upper thigh pain x1 day. States she was at the gym doing squats & pain started right after. Pt denies any trauma or injury. No otc tx. Pt stated there may be a chance she is pregnant, she is 2 days late for her period.

## 2022-11-20 ENCOUNTER — Emergency Department: Payer: BC Managed Care – PPO

## 2022-11-20 ENCOUNTER — Emergency Department
Admission: EM | Admit: 2022-11-20 | Discharge: 2022-11-20 | Disposition: A | Discharge status: Home / Self Care | Payer: BC Managed Care – PPO | Attending: Emergency Medicine | Admitting: Emergency Medicine

## 2022-11-20 ENCOUNTER — Other Ambulatory Visit: Payer: Self-pay

## 2022-11-20 ENCOUNTER — Encounter: Payer: Self-pay | Admitting: Intensive Care

## 2022-11-20 DIAGNOSIS — R1010 Upper abdominal pain, unspecified: Secondary | ICD-10-CM | POA: Diagnosis present

## 2022-11-20 DIAGNOSIS — K2900 Acute gastritis without bleeding: Secondary | ICD-10-CM | POA: Insufficient documentation

## 2022-11-20 LAB — COMPREHENSIVE METABOLIC PANEL
ALT: 13 U/L (ref 0–44)
AST: 16 U/L (ref 15–41)
Albumin: 3.8 g/dL (ref 3.5–5.0)
Alkaline Phosphatase: 59 U/L (ref 38–126)
Anion gap: 7 (ref 5–15)
BUN: 8 mg/dL (ref 6–20)
CO2: 26 mmol/L (ref 22–32)
Calcium: 8.6 mg/dL — ABNORMAL LOW (ref 8.9–10.3)
Chloride: 105 mmol/L (ref 98–111)
Creatinine, Ser: 0.69 mg/dL (ref 0.44–1.00)
GFR, Estimated: >60 mL/min (ref >60)
Glucose, Bld: 93 mg/dL (ref 70–99)
Potassium: 3.6 mmol/L (ref 3.5–5.1)
Sodium: 138 mmol/L (ref 135–145)
Total Bilirubin: 0.9 mg/dL (ref 0.3–1.2)
Total Protein: 7.1 g/dL (ref 6.5–8.1)

## 2022-11-20 LAB — CBC
HCT: 41.9 % (ref 36.0–46.0)
Hemoglobin: 13.5 g/dL (ref 12.0–15.0)
MCH: 28 pg (ref 26.0–34.0)
MCHC: 32.2 g/dL (ref 30.0–36.0)
MCV: 86.9 fL (ref 80.0–100.0)
Platelets: 317 10*3/uL (ref 150–400)
RBC: 4.82 MIL/uL (ref 3.87–5.11)
RDW: 12.7 % (ref 11.5–15.5)
WBC: 6.6 10*3/uL (ref 4.0–10.5)
nRBC: 0 % (ref 0.0–0.2)

## 2022-11-20 LAB — URINALYSIS, ROUTINE W REFLEX MICROSCOPIC
Bilirubin Urine: NEGATIVE
Glucose, UA: NEGATIVE mg/dL
Hgb urine dipstick: NEGATIVE
Ketones, ur: NEGATIVE mg/dL
Nitrite: NEGATIVE
Protein, ur: NEGATIVE mg/dL
Specific Gravity, Urine: 1.005 (ref 1.005–1.030)
pH: 8 (ref 5.0–8.0)

## 2022-11-20 LAB — PREGNANCY, URINE: Preg Test, Ur: NEGATIVE

## 2022-11-20 LAB — LIPASE, BLOOD: Lipase: 33 U/L (ref 11–51)

## 2022-11-20 MED ORDER — PANTOPRAZOLE SODIUM 40 MG PO TBEC: 40.0000 mg | DELAYED_RELEASE_TABLET | Freq: Every day | ORAL | 0 refills | Status: DC

## 2022-11-20 NOTE — ED Triage Notes (Signed)
First nurse note: C/o RUQ pain X2 weeks. Reports diarrhea. Denies N/V. Not painful to touch. Sent from [facility] to rule out gallbladder issues

## 2022-11-20 NOTE — ED Provider Notes (Signed)
   [facility] Provider Note    Event Date/Time   First MD Initiated Contact with Patient 11/20/22 1222     (approximate)   History   Abdominal Pain   HPI  Kristen Romero is a 27 y.o. female with no past surgical history presents with complaints of upper abdominal discomfort intermittently over the last several weeks.  It does seem to be related to eating, dependent on what she eats as well.  Currently feeling well.  Sent to the emergency department for evaluation of possible gallbladder disease.     Physical Exam   Triage Vital Signs: ED Triage Vitals [11/20/22 1210]  Enc Vitals Group     BP 129/64     Pulse Rate 74     Resp 16     Temp 98.6 F (37 C)     Temp Source Oral     SpO2 100 %     Weight 94.8 kg (209 lb)     Height 1.575 m ( )     Head Circumference      Peak Flow      Pain Score 2     Pain Loc      Pain Edu?      Excl. in GC?     Most recent vital signs: Vitals:   11/20/22 1210  BP: 129/64  Pulse: 74  Resp: 16  Temp: 98.6 F (37 C)  SpO2: 100%     General: Awake, no distress.  CV:  Good peripheral perfusion.  Resp:  Normal effort.  Abd:  No distention.  Soft, nontender reassuring exam Other:     ED Results / Procedures / Treatments   Labs (all labs ordered are listed, but only abnormal results are displayed) Labs Reviewed  COMPREHENSIVE METABOLIC PANEL - Abnormal; Notable for the following components:      Result Value   Calcium 8.6 (*)    All other components within normal limits  URINALYSIS, ROUTINE W REFLEX MICROSCOPIC - Abnormal; Notable for the following components:   Color, Urine STRAW (*)    APPearance CLEAR (*)    Leukocytes,Ua SMALL (*)    Bacteria, UA RARE (*)    All other components within normal limits  LIPASE, BLOOD  CBC  PREGNANCY, URINE  POC URINE PREG, ED     EKG     RADIOLOGY Ultrasound right upper quadrant    PROCEDURES:  Critical Care performed:    Procedures   MEDICATIONS ORDERED IN ED: Medications - No data to display   IMPRESSION / MDM / ASSESSMENT AND PLAN / ED COURSE  I reviewed the triage vital signs and the nursing notes. Patient's presentation is most consistent with acute illness / injury with system symptoms.  Patient presents with epigastric abdominal discomfort intermittently over the last several weeks.  Differential includes gastritis, biliary colic, less likely related to pancreatitis  Will obtain labs, pregnancy test, obtain ultrasound of the right upper quadrant and reevaluate        FINAL CLINICAL IMPRESSION(S) / ED DIAGNOSES   Final diagnoses:  None     Rx / DC Orders   ED Discharge Orders     None        Note:  This document was prepared using Dragon voice recognition software and may include unintentional dictation errors.

## 2023-06-12 DIAGNOSIS — O0993 Supervision of high risk pregnancy, unspecified, third trimester: Secondary | ICD-10-CM | POA: Insufficient documentation

## 2023-06-21 ENCOUNTER — Emergency Department: Payer: BC Managed Care – PPO

## 2023-06-21 ENCOUNTER — Emergency Department
Admission: EM | Admit: 2023-06-21 | Discharge: 2023-06-21 | Disposition: A | Discharge status: Home / Self Care | Payer: BC Managed Care – PPO | Attending: Emergency Medicine | Admitting: Emergency Medicine

## 2023-06-21 ENCOUNTER — Other Ambulatory Visit: Payer: Self-pay

## 2023-06-21 DIAGNOSIS — Z3A08 8 weeks gestation of pregnancy: Secondary | ICD-10-CM | POA: Insufficient documentation

## 2023-06-21 DIAGNOSIS — O418X10 Other specified disorders of amniotic fluid and membranes, first trimester, not applicable or unspecified: Secondary | ICD-10-CM | POA: Insufficient documentation

## 2023-06-21 DIAGNOSIS — O209 Hemorrhage in early pregnancy, unspecified: Secondary | ICD-10-CM | POA: Diagnosis present

## 2023-06-21 DIAGNOSIS — O4690 Antepartum hemorrhage, unspecified, unspecified trimester: Secondary | ICD-10-CM

## 2023-06-21 LAB — CBC
HCT: 39.1 % (ref 36.0–46.0)
Hemoglobin: 13.1 g/dL (ref 12.0–15.0)
MCH: 28.2 pg (ref 26.0–34.0)
MCHC: 33.5 g/dL (ref 30.0–36.0)
MCV: 84.1 fL (ref 80.0–100.0)
Platelets: 323 10*3/uL (ref 150–400)
RBC: 4.65 MIL/uL (ref 3.87–5.11)
RDW: 13.5 % (ref 11.5–15.5)
WBC: 9.8 10*3/uL (ref 4.0–10.5)
nRBC: 0 % (ref 0.0–0.2)

## 2023-06-21 LAB — URINALYSIS, ROUTINE W REFLEX MICROSCOPIC
Bilirubin Urine: NEGATIVE
Glucose, UA: NEGATIVE mg/dL
Ketones, ur: 5 mg/dL — AB
Leukocytes,Ua: NEGATIVE
Nitrite: NEGATIVE
Protein, ur: NEGATIVE mg/dL
RBC / HPF: >50 RBC/hpf (ref 0–5)
Specific Gravity, Urine: 1.008 (ref 1.005–1.030)
Squamous Epithelial / HPF: 0 /[HPF] (ref 0–5)
pH: 7 (ref 5.0–8.0)

## 2023-06-21 LAB — COMPREHENSIVE METABOLIC PANEL
ALT: 20 U/L (ref 0–44)
AST: 21 U/L (ref 15–41)
Albumin: 4 g/dL (ref 3.5–5.0)
Alkaline Phosphatase: 48 U/L (ref 38–126)
Anion gap: 10 (ref 5–15)
BUN: 10 mg/dL (ref 6–20)
CO2: 23 mmol/L (ref 22–32)
Calcium: 8.8 mg/dL — ABNORMAL LOW (ref 8.9–10.3)
Chloride: 102 mmol/L (ref 98–111)
Creatinine, Ser: 0.63 mg/dL (ref 0.44–1.00)
GFR, Estimated: >60 mL/min (ref >60)
Glucose, Bld: 98 mg/dL (ref 70–99)
Potassium: 3.7 mmol/L (ref 3.5–5.1)
Sodium: 135 mmol/L (ref 135–145)
Total Bilirubin: 0.8 mg/dL (ref <1.2)
Total Protein: 7.8 g/dL (ref 6.5–8.1)

## 2023-06-21 LAB — HCG, QUANTITATIVE, PREGNANCY: hCG, Beta Chain, Quant, S: 163225 m[IU]/mL — ABNORMAL HIGH (ref <5)

## 2023-06-21 LAB — ABO/RH: ABO/RH(D): O POS

## 2023-06-21 LAB — PREGNANCY, URINE: Preg Test, Ur: POSITIVE — AB

## 2023-06-21 NOTE — Discharge Instructions (Signed)
Your exam, labs, and ultrasound are normal and reassuring at this time.  The ultrasound confirms a normally developing intrauterine pregnancy with confirmed heart beat and measuring 8 weeks and 1 day.  You should allow for vaginal rest for a few days.  Continue with your prenatal vitamin and nausea medicine as needed.  Follow-up with your OB or midwife as discussed.  Return to the ED if necessary.

## 2023-06-21 NOTE — ED Provider Notes (Signed)
[facility] Emergency Department Provider Note     Event Date/Time   First MD Initiated Contact with Patient 06/21/23 2003     (approximate)   History   Vaginal Bleeding   HPI  Kristen Romero is a 27 y.o. female G1P0, at [redacted] weeks gestation with a single IUP, presents to the ED for evaluation of bright red blood per vagina.  Patient will report a gush of bright red blood that she went to the bathroom, just prior to arrival.  She reports the blood soaked her underwear.  She denied any preceding vaginal cramping or pain.  No nausea, vomiting, or chest pain reported.  Patient reports only light pink discharge on the tissue when she emptied her bladder prior to the ultrasound ordered from triage.  Physical Exam   Triage Vital Signs: ED Triage Vitals  Encounter Vitals Group     BP 06/21/23 1912 135/84     Systolic BP Percentile --      Diastolic BP Percentile --      Pulse Rate 06/21/23 1912 90     Resp 06/21/23 1912 18     Temp 06/21/23 1912 97.9 F (36.6 C)     Temp Source 06/21/23 1912 Oral     SpO2 06/21/23 1912 100 %     Weight 06/21/23 1910 210 lb (95.3 kg)     Height 06/21/23 1910 5\' 2"  (1.575 m)     Head Circumference --      Peak Flow --      Pain Score 06/21/23 1910 0     Pain Loc --      Pain Education --      Exclude from Growth Chart --     Most recent vital signs: Vitals:   06/21/23 1912  BP: 135/84  Pulse: 90  Resp: 18  Temp: 97.9 F (36.6 C)  SpO2: 100%    General Awake, no distress. NAD HEENT NCAT. PERRL. EOMI. No rhinorrhea. Mucous membranes are moist.  CV:  Good peripheral perfusion. RRR RESP:  Normal effort. CTA ABD:  No distention.  Soft and nontender.  Normal bowel sounds noted. GU:  Deferred   ED Results / Procedures / Treatments   Labs (all labs ordered are listed, but only abnormal results are displayed) Labs Reviewed  COMPREHENSIVE METABOLIC PANEL - Abnormal; Notable for the following components:       Result Value   Calcium 8.8 (*)    All other components within normal limits  HCG, QUANTITATIVE, PREGNANCY - Abnormal; Notable for the following components:   hCG, Beta Chain, [place] Longest 295,621 (*)    All other components within normal limits  URINALYSIS, ROUTINE W REFLEX MICROSCOPIC - Abnormal; Notable for the following components:   Color, Urine STRAW (*)    APPearance CLEAR (*)    Hgb urine dipstick LARGE (*)    Ketones, ur 5 (*)    Bacteria, UA RARE (*)    All other components within normal limits  PREGNANCY, URINE - Abnormal; Notable for the following components:   Preg Test, Ur POSITIVE (*)    All other components within normal limits  CBC  ABO/RH     EKG   RADIOLOGY  I personally viewed and evaluated these images as part of my medical decision making, as well as reviewing the written report by the radiologist.  ED Provider Interpretation: Single IUP; subchorionic hemorrhage  US OB LESS THAN 14 WEEKS WITH OB TRANSVAGINAL  Result Date: 06/21/2023  CLINICAL DATA:  Pregnancy with bleeding. EXAM: OBSTETRIC <14 WK [place] AND TRANSVAGINAL OB US TECHNIQUE: Both transabdominal and transvaginal ultrasound examinations were performed for complete evaluation of the gestation as well as the maternal uterus, adnexal regions, and pelvic cul-de-sac. Transvaginal technique was performed to assess early pregnancy. COMPARISON:  None Available. FINDINGS: Intrauterine gestational sac: Single intrauterine gestational sac Yolk sac:  Seen Embryo:  Present Cardiac Activity: Detected Heart Rate: 169 bpm CRL:  17 mm   8 w   1 d                  [place] EDC: 01/30/2024 Subchorionic hemorrhage: Small subchorionic hemorrhage measuring up to 2.5 cm. Maternal uterus/adnexae: The ovaries are not visualized. IMPRESSION: Single live intrauterine pregnancy with an estimated gestational age of [redacted] weeks, 1 day by ultrasound. Electronically Signed   By: Elgie Collard M.D.   On: 06/21/2023 20:46     PROCEDURES:  Critical  Care performed: No  Procedures   MEDICATIONS ORDERED IN ED: Medications - No data to display   IMPRESSION / MDM / ASSESSMENT AND PLAN / ED COURSE  I reviewed the triage vital signs and the nursing notes.                              Differential diagnosis includes, but is not limited to, threatened miscarriage, incomplete miscarriage, normal bleeding from an early trimester pregnancy, ectopic pregnancy, , blighted ovum, vaginal/cervical trauma, subchorionic hemorrhage/hematoma, etc.   Patient's presentation is most consistent with acute complicated illness / injury requiring diagnostic workup.  Patient's diagnosis is consistent with subchorionic hemorrhage causing first trimester bleeding.  Patient without any reports of any ongoing intractable bleeding at the time of this interval evaluation.  Labs are reassuring including normal CBC and BMP.  Beta quant is elevated at >163,000.  Patient will be discharged home with instructions to take her prenatal vitamin as prescribed. Patient is to follow up with her nurse midwife as scheduled, as needed or otherwise directed. Patient is given ED precautions to return to the ED for any worsening or new symptoms.  FINAL CLINICAL IMPRESSION(S) / ED DIAGNOSES   Final diagnoses:  Vaginal bleeding in pregnancy  Subchorionic hematoma in first trimester, single or unspecified fetus     Rx / DC Orders   ED Discharge Orders     None        Note:  This document was prepared using Dragon voice recognition software and may include unintentional dictation errors.    Lissa Hoard, PA-C 06/21/23 2113    Janith Lima, MD 06/25/23 [phone]

## 2023-06-21 NOTE — ED Triage Notes (Signed)
Pt reports she is [redacted] wks pregnant. Just prior to arrival she experienced what she describes as a gush of bright red blood. Reports passage of 1 clot. States the blood soaked through her underwear. Pt denies abd pain or pelvic pain. States this is her first pregnancy. Pt ambulatory to triage. Alert and oriented. Breathing unlabored speaking in full sentences.

## 2023-06-21 NOTE — ED Notes (Signed)
Patient to the bathroom in triage and called RN to bathroom. Pt wished to show RN contents in toilet. Bright red blood and passage of 1 large blood clot noted in toilet. Pt ambulatory back to lobby with independent and steady gait.

## 2023-06-21 NOTE — ED Notes (Signed)
Pelvic cart not needed per [facility] and verified from [facility], PA-C. Pt is to be discharged home.

## 2023-06-22 LAB — SAMPLE TO BLOOD BANK

## 2023-07-10 LAB — OB RESULTS CONSOLE RUBELLA ANTIBODY, IGM: Rubella: IMMUNE

## 2023-07-10 LAB — OB RESULTS CONSOLE TSH: TSH: 0.673

## 2023-07-10 LAB — OB RESULTS CONSOLE HIV ANTIBODY (ROUTINE TESTING): HIV: NONREACTIVE

## 2023-07-10 LAB — OB RESULTS CONSOLE RPR: RPR: NONREACTIVE

## 2023-07-10 LAB — OB RESULTS CONSOLE GC/CHLAMYDIA
Chlamydia: NEGATIVE
Neisseria Gonorrhea: NEGATIVE

## 2023-07-10 LAB — HEPATITIS C ANTIBODY: HCV Ab: NEGATIVE

## 2023-07-10 LAB — OB RESULTS CONSOLE VARICELLA ZOSTER ANTIBODY, IGG: Varicella: IMMUNE

## 2023-08-01 NOTE — L&D Delivery Note (Signed)
.  Delivery Note  Kristen Romero is a G1P1001 at [redacted]w[redacted]d with an LMP of 04/15/23, not consistent with US  at [redacted]w[redacted]d.   First Stage: Labor onset: 1330 Induction: misoprostol , oxytocin , and cervical balloon Analgesia /Anesthesia intrapartum: Epidural and IV pain meds Date/time: 02/05/24 1330, Amount: gush, and Color: clear GBS: Positive/-- (06/02 0000)  IP Antibiotics: abx: PCN x 5  Second Stage: Complete dilation at 0637 Onset of pushing at 064 FHR second stage Baseline: 150 bpm   [name] presented to L&D for postdates IOL She was 1/50/-1. She progressed  to C/C/+2 with a spontaneous urge to push.  She pushed  effectively over approximately 22 minutes for a spontaneous vaginal birth. Delivery of a viable baby girl on 02/06/2024 . by CNM. Delivery of fetal head in position: Occiput,, Anterior position with restitution to position: Left,, Occiput,, Transverse loose nuchal cord reduced  Anterior then posterior shoulders delivered easily with gentle downward traction. Baby placed on mom's chest, and attended to by baby RN. Cord double clamped after cessation of pulsation, cut by FOB    Third Stage: Oxytocin  bolus started after delivery of infant for hemorrhage prophylaxis  Placenta delivered schultz intact with 3 VC @ 9287 Placenta disposition: To Pathology: No  Uterine tone firm / exam; vaginal bleeding: heavy  Laceration: 1st degree, 2nd degree, vaginal, and labial laceration identified with pumping vessels Anesthesia for repair: procedures; anesthesia: epidural Repair suture type: 2.0 3.0 vicryl Est. Blood Loss (mL): 600  Complications:Diagnoses; OB-GYN delivery complications: maternal temperature  Mom to postpartum.  Baby to Couplet care / Skin to Skin.  Newborn: Information for the patient's newborn:  Jaydah, Stahle Girl Meher [[id number]]  Live born female Khelani Birth Weight:   APGAR: 8, 9  Newborn Delivery   Birth date/time: 02/06/2024 07:07:00 Delivery type: Vaginal, Spontaneous       Feeding planned: breast feeding  ---------- Bobbette Brunswick, CNM Certified Nurse Midwife [facility]

## 2023-08-14 ENCOUNTER — Telehealth: Payer: Self-pay

## 2023-08-14 ENCOUNTER — Other Ambulatory Visit: Payer: Self-pay | Admitting: Obstetrics and Gynecology

## 2023-08-14 DIAGNOSIS — Z3402 Encounter for supervision of normal first pregnancy, second trimester: Secondary | ICD-10-CM

## 2023-08-14 DIAGNOSIS — Z3A14 14 weeks gestation of pregnancy: Secondary | ICD-10-CM

## 2023-08-28 ENCOUNTER — Encounter: Payer: BC Managed Care – PPO | Admitting: Family Medicine

## 2023-09-06 ENCOUNTER — Encounter: Payer: Self-pay | Admitting: *Deleted

## 2023-09-06 DIAGNOSIS — O99210 Obesity complicating pregnancy, unspecified trimester: Secondary | ICD-10-CM | POA: Insufficient documentation

## 2023-09-06 DIAGNOSIS — O468X9 Other antepartum hemorrhage, unspecified trimester: Secondary | ICD-10-CM | POA: Insufficient documentation

## 2023-09-13 ENCOUNTER — Other Ambulatory Visit: Payer: Self-pay | Admitting: *Deleted

## 2023-09-13 ENCOUNTER — Ambulatory Visit: Payer: BC Managed Care – PPO | Admitting: *Deleted

## 2023-09-13 ENCOUNTER — Ambulatory Visit: Payer: BC Managed Care – PPO

## 2023-09-13 ENCOUNTER — Other Ambulatory Visit: Payer: Self-pay

## 2023-09-13 ENCOUNTER — Encounter: Payer: Self-pay | Admitting: *Deleted

## 2023-09-13 ENCOUNTER — Ambulatory Visit: Payer: BC Managed Care – PPO | Attending: Obstetrics and Gynecology

## 2023-09-13 VITALS — BP 119/57 | HR 102

## 2023-09-13 DIAGNOSIS — O208 Other hemorrhage in early pregnancy: Secondary | ICD-10-CM | POA: Diagnosis not present

## 2023-09-13 DIAGNOSIS — O99210 Obesity complicating pregnancy, unspecified trimester: Secondary | ICD-10-CM | POA: Diagnosis present

## 2023-09-13 DIAGNOSIS — Z3A14 14 weeks gestation of pregnancy: Secondary | ICD-10-CM | POA: Insufficient documentation

## 2023-09-13 DIAGNOSIS — Z362 Encounter for other antenatal screening follow-up: Secondary | ICD-10-CM

## 2023-09-13 DIAGNOSIS — O99212 Obesity complicating pregnancy, second trimester: Secondary | ICD-10-CM | POA: Insufficient documentation

## 2023-09-13 DIAGNOSIS — Z3A20 20 weeks gestation of pregnancy: Secondary | ICD-10-CM | POA: Diagnosis not present

## 2023-09-13 DIAGNOSIS — Z363 Encounter for antenatal screening for malformations: Secondary | ICD-10-CM | POA: Insufficient documentation

## 2023-09-13 DIAGNOSIS — Z3402 Encounter for supervision of normal first pregnancy, second trimester: Secondary | ICD-10-CM | POA: Insufficient documentation

## 2023-09-13 DIAGNOSIS — O468X9 Other antepartum hemorrhage, unspecified trimester: Secondary | ICD-10-CM

## 2023-09-20 LAB — CYTOLOGY - PAP: Pap: NEGATIVE

## 2023-10-15 ENCOUNTER — Ambulatory Visit: Payer: BC Managed Care – PPO | Attending: Obstetrics and Gynecology

## 2023-10-15 ENCOUNTER — Other Ambulatory Visit: Payer: Self-pay | Admitting: Obstetrics and Gynecology

## 2023-10-15 ENCOUNTER — Other Ambulatory Visit: Payer: Self-pay

## 2023-10-15 DIAGNOSIS — Z3A24 24 weeks gestation of pregnancy: Secondary | ICD-10-CM | POA: Insufficient documentation

## 2023-10-15 DIAGNOSIS — O99212 Obesity complicating pregnancy, second trimester: Secondary | ICD-10-CM | POA: Insufficient documentation

## 2023-10-15 DIAGNOSIS — Z362 Encounter for other antenatal screening follow-up: Secondary | ICD-10-CM | POA: Insufficient documentation

## 2023-10-15 DIAGNOSIS — E669 Obesity, unspecified: Secondary | ICD-10-CM

## 2023-10-15 DIAGNOSIS — O4590 Premature separation of placenta, unspecified, unspecified trimester: Secondary | ICD-10-CM | POA: Diagnosis present

## 2023-12-10 ENCOUNTER — Ambulatory Visit

## 2023-12-31 ENCOUNTER — Other Ambulatory Visit: Payer: Self-pay

## 2023-12-31 ENCOUNTER — Ambulatory Visit: Attending: Obstetrics and Gynecology

## 2023-12-31 DIAGNOSIS — O4593 Premature separation of placenta, unspecified, third trimester: Secondary | ICD-10-CM | POA: Diagnosis not present

## 2023-12-31 DIAGNOSIS — O99212 Obesity complicating pregnancy, second trimester: Secondary | ICD-10-CM

## 2023-12-31 DIAGNOSIS — Z3A35 35 weeks gestation of pregnancy: Secondary | ICD-10-CM | POA: Diagnosis not present

## 2023-12-31 DIAGNOSIS — Z362 Encounter for other antenatal screening follow-up: Secondary | ICD-10-CM | POA: Diagnosis present

## 2023-12-31 DIAGNOSIS — O99213 Obesity complicating pregnancy, third trimester: Secondary | ICD-10-CM | POA: Diagnosis present

## 2023-12-31 LAB — OB RESULTS CONSOLE GBS: GBS: POSITIVE

## 2024-01-29 ENCOUNTER — Other Ambulatory Visit: Payer: Self-pay | Admitting: Certified Nurse Midwife

## 2024-01-29 DIAGNOSIS — Z3490 Encounter for supervision of normal pregnancy, unspecified, unspecified trimester: Secondary | ICD-10-CM

## 2024-01-29 NOTE — Progress Notes (Signed)
 Patient's last menstrual period was 04/15/2023 (exact date). Estimated Date of Delivery: 01/31/24  28 y.o. G1P0000 at [redacted]w[redacted]d  The primary encounter diagnosis was Supervision of high risk pregnancy in third trimester (HHS-HCC). Diagnoses of Obesity affecting pregnancy in third trimester, unspecified obesity type (HHS-HCC) and Positive GBS test were also pertinent to this visit.  S:   Patient concerns today:  - wants IOL scheduled  Chief Complaint  Patient presents with  . Non-stress Test  . Routine Prenatal Visit    Reports: good fetal movement, occasional Braxton Hicks contractions  Denies bleeding, contractions, cramping or leaking.   O:   See [facility] flowsheets. LMP 04/15/2023 (Exact Date)  Gen: NAD  Pulm: No use of accessory muscles, normal respirations Abdomen: Gravid, nontender Pelvic: SVE 2.5/50/-3 Ext : mild edema, no rashes Psych: Mood, insight, judgement intact  NST: for obesity in pregnancy Baseline: 130 bpm Variability: moderate Accels: >2 15x15  Decels: none Time: 30 min Category I NST reactive  A/P:  28 y.o. G1P0000 at [redacted]w[redacted]d   - Problem list reviewed and/or updated.  1. Supervision of high risk pregnancy in third trimester (HHS-HCC) GBS positive, no allergies, treat with PCN in labor. Labor plans reviewed; IOL scheduled for 01/30/24 @ 0500. Kick counts reviewed with the patient in detail.  Patient instructed to assess for fetal activity daily at regular intervals.  Counts to be done if decreased activity noted.  Patient instructed to contact the office if counts do not reveal adequate movements. Labor precautions (ROM, vaginal bleeding, s/s labor) reviewed.  Pt verbalized understanding.  2. Obesity affecting pregnancy in third trimester, unspecified obesity type (HHS-HCC) TWG: 13.2 kg (29 lb) There is no height or weight on file to calculate BMI.  3. Positive GBS test Treat with PCN in labor    Call with bleeding, contractions, PROM, or  decreased fetal movement.   Return in about 1 week (around 02/05/2024) for scheduled IOL.   I personally performed the service, non-incident to. [facility])   DANIELLE CHARLIES BLUSH , CNM 01/29/2024 11:57 AM

## 2024-02-02 ENCOUNTER — Observation Stay
Admission: EM | Admit: 2024-02-02 | Discharge: 2024-02-02 | Disposition: A | Discharge status: Home / Self Care | Attending: Obstetrics and Gynecology | Admitting: Obstetrics and Gynecology

## 2024-02-02 DIAGNOSIS — Z7982 Long term (current) use of aspirin: Secondary | ICD-10-CM | POA: Insufficient documentation

## 2024-02-02 DIAGNOSIS — O368130 Decreased fetal movements, third trimester, not applicable or unspecified: Secondary | ICD-10-CM | POA: Insufficient documentation

## 2024-02-02 DIAGNOSIS — Z3A40 40 weeks gestation of pregnancy: Secondary | ICD-10-CM | POA: Insufficient documentation

## 2024-02-02 NOTE — Final Progress Note (Signed)
 Physician Final Progress Note  Patient ID: Kristen Romero MRN: [id number] DOB/AGE: [birth year] 28 y.o.  Admit date: 02/02/2024 Admitting provider: Garnette JONETTA Mace, MD Discharge date: 02/02/2024   Admission Diagnoses:  1) intrauterine pregnancy at [redacted]w[redacted]d  2) decreased fetal movement, third trimester  Discharge Diagnoses:  Principal Problem:   Decreased fetal movement affecting management of pregnancy in third trimester  intrauterine pregnancy at [redacted]w[redacted]d   History of Present Illness: The patient is a 28 y.o. female G1P0 at [redacted]w[redacted]d who presents for decreased fetal movement this evening. She denies vaginal bleeding, leaking fluid, and contractions.     Past Medical History:  Diagnosis Date   Anxiety     Past Surgical History:  Procedure Laterality Date   NO PAST SURGERIES      No current facility-administered medications on file prior to encounter.   Current Outpatient Medications on File Prior to Encounter  Medication Sig Dispense Refill   magnesium oxide (MAG-OX) 400 (240 Mg) MG tablet Take 400 mg by mouth at bedtime.     Prenatal Vit-Fe Fumarate-FA (PRENATAL PO) Take by mouth.     aspirin EC 81 MG tablet Take 81 mg by mouth daily. Swallow whole. (Patient not taking: Reported on 02/02/2024)     pantoprazole  (PROTONIX ) 40 MG tablet Take 1 tablet (40 mg total) by mouth daily for 14 days. 14 tablet 0    No Known Allergies  Social History   Socioeconomic History   Marital status: Married    Spouse name: Not on file   Number of children: Not on file   Years of education: Not on file   Highest education level: Not on file  Occupational History   Not on file  Tobacco Use   Smoking status: Never   Smokeless tobacco: Never  Vaping Use   Vaping status: Never Used  Substance and Sexual Activity   Alcohol use: Not Currently   Drug use: No   Sexual activity: Yes  Other Topics Concern   Not on file  Social History Narrative   Not on file   Social Drivers of Health   Financial  Resource Strain: Not on file  Food Insecurity: Not on file  Transportation Needs: Not on file  Physical Activity: Not on file  Stress: Not on file  Social Connections: Not on file  Intimate Partner Violence: Not on file    Family History  Problem Relation Age of Onset   Diabetes Maternal Grandfather      Review of Systems  Constitutional: Negative.   HENT: Negative.    Eyes: Negative.   Respiratory: Negative.    Cardiovascular: Negative.   Gastrointestinal: Negative.   Genitourinary: Negative.   Musculoskeletal: Negative.   Skin: Negative.   Neurological: Negative.   Psychiatric/Behavioral: Negative.       Physical Exam: BP (!) 114/59   Pulse 87   Temp 97.7 F (36.5 C) (Oral)   Resp 14   LMP 04/15/2023 (Exact Date)   Physical Exam Constitutional:      General: She is not in acute distress.    Appearance: Normal appearance.  HENT:     Head: Normocephalic and atraumatic.  Eyes:     General: No scleral icterus.    Conjunctiva/sclera: Conjunctivae normal.  Neurological:     General: No focal deficit present.     Mental Status: She is alert and oriented to person, place, and time.     Cranial Nerves: No cranial nerve deficit.  Psychiatric:  Mood and Affect: Mood normal.        Behavior: Behavior normal.        Judgment: Judgment normal.     Consults: None  Significant Findings/ Diagnostic Studies: none  Procedures:  NST: Baseline FHR: 135 beats/min Variability: moderate Accelerations: present Decelerations: absent Tocometry: absent  Interpretation:  INDICATIONS: decreased fetal movement RESULTS:  A NST procedure was performed with FHR monitoring and a normal baseline established, appropriate time of 20-40 minutes of evaluation, and accels >2 seen w 15x15 characteristics.  Results show a REACTIVE NST.    Hospital Course: The patient was admitted to Labor and Delivery Triage for observation. She had normal vital signs. The fetal tracing was  category 1 and reactive.  The patient was reassured about her baby and reported an increase in fetal movement after arrival to labor and delivery.  She was deemed to be stable for discharge with a scheduled induction of labor on 02/04/2024 at 0800.  Discharge Condition: stable  Disposition: Discharge disposition: 01-Home or Self Care       Diet: Regular diet  Discharge Activity: Activity as tolerated   Allergies as of 02/02/2024   No Known Allergies      Medication List     TAKE these medications    aspirin EC 81 MG tablet Take 81 mg by mouth daily. Swallow whole.   magnesium oxide 400 (240 Mg) MG tablet Commonly known as: MAG-OX Take 400 mg by mouth at bedtime.   pantoprazole  40 MG tablet Commonly known as: Protonix  Take 1 tablet (40 mg total) by mouth daily for 14 days.   PRENATAL PO Take by mouth.         Total time spent taking care of this patient: 35 minutes  Signed: Garnette Mace, MD  02/02/2024, 10:22 PM

## 2024-02-02 NOTE — Discharge Summary (Signed)
 Physician Discharge Summary Note  Patient ID: Kristen Romero MRN: [id number] DOB/AGE: 28/11/1995 28 y.o.  Admit date: 02/02/2024 Admitting provider: Garnette JONETTA Mace, MD Discharge date: 02/02/2024   Admission Diagnoses:  1) intrauterine pregnancy at [redacted]w[redacted]d  2) decreased fetal movement, third trimester  Discharge Diagnoses:  Principal Problem:   Decreased fetal movement affecting management of pregnancy in third trimester  intrauterine pregnancy at [redacted]w[redacted]d   History of Present Illness: The patient is a 28 y.o. female G1P0 at [redacted]w[redacted]d who presents for decreased fetal movement this evening. She denies vaginal bleeding, leaking fluid, and contractions.     Past Medical History:  Diagnosis Date   Anxiety     Past Surgical History:  Procedure Laterality Date   NO PAST SURGERIES      No current facility-administered medications on file prior to encounter.   Current Outpatient Medications on File Prior to Encounter  Medication Sig Dispense Refill   magnesium oxide (MAG-OX) 400 (240 Mg) MG tablet Take 400 mg by mouth at bedtime.     Prenatal Vit-Fe Fumarate-FA (PRENATAL PO) Take by mouth.     aspirin EC 81 MG tablet Take 81 mg by mouth daily. Swallow whole. (Patient not taking: Reported on 02/02/2024)     pantoprazole  (PROTONIX ) 40 MG tablet Take 1 tablet (40 mg total) by mouth daily for 14 days. 14 tablet 0    No Known Allergies  Social History   Socioeconomic History   Marital status: Married    Spouse name: Not on file   Number of children: Not on file   Years of education: Not on file   Highest education level: Not on file  Occupational History   Not on file  Tobacco Use   Smoking status: Never   Smokeless tobacco: Never  Vaping Use   Vaping status: Never Used  Substance and Sexual Activity   Alcohol use: Not Currently   Drug use: No   Sexual activity: Yes  Other Topics Concern   Not on file  Social History Narrative   Not on file   Social Drivers of Health    Financial Resource Strain: Not on file  Food Insecurity: Not on file  Transportation Needs: Not on file  Physical Activity: Not on file  Stress: Not on file  Social Connections: Not on file  Intimate Partner Violence: Not on file    Family History  Problem Relation Age of Onset   Diabetes Maternal Grandfather      Review of Systems  Constitutional: Negative.   HENT: Negative.    Eyes: Negative.   Respiratory: Negative.    Cardiovascular: Negative.   Gastrointestinal: Negative.   Genitourinary: Negative.   Musculoskeletal: Negative.   Skin: Negative.   Neurological: Negative.   Psychiatric/Behavioral: Negative.       Physical Exam: BP (!) 114/59   Pulse 87   Temp 97.7 F (36.5 C) (Oral)   Resp 14   LMP 04/15/2023 (Exact Date)   Physical Exam Constitutional:      General: She is not in acute distress.    Appearance: Normal appearance.  HENT:     Head: Normocephalic and atraumatic.  Eyes:     General: No scleral icterus.    Conjunctiva/sclera: Conjunctivae normal.  Neurological:     General: No focal deficit present.     Mental Status: She is alert and oriented to person, place, and time.     Cranial Nerves: No cranial nerve deficit.  Psychiatric:  Mood and Affect: Mood normal.        Behavior: Behavior normal.        Judgment: Judgment normal.     Consults: None  Significant Findings/ Diagnostic Studies: none  Procedures:  NST: Baseline FHR: 135 beats/min Variability: moderate Accelerations: present Decelerations: absent Tocometry: absent  Interpretation:  INDICATIONS: decreased fetal movement RESULTS:  A NST procedure was performed with FHR monitoring and a normal baseline established, appropriate time of 20-40 minutes of evaluation, and accels >2 seen w 15x15 characteristics.  Results show a REACTIVE NST.    Hospital Course: The patient was admitted to Labor and Delivery Triage for observation. She had normal vital signs. The fetal  tracing was category 1 and reactive.  The patient was reassured about her baby and reported an increase in fetal movement after arrival to labor and delivery.  She was deemed to be stable for discharge with a scheduled induction of labor on 02/04/2024 at 0800.  Discharge Condition: stable  Disposition: Discharge disposition: 01-Home or Self Care       Diet: Regular diet  Discharge Activity: Activity as tolerated   Allergies as of 02/02/2024   No Known Allergies      Medication List     TAKE these medications    aspirin EC 81 MG tablet Take 81 mg by mouth daily. Swallow whole.   magnesium oxide 400 (240 Mg) MG tablet Commonly known as: MAG-OX Take 400 mg by mouth at bedtime.   pantoprazole  40 MG tablet Commonly known as: Protonix  Take 1 tablet (40 mg total) by mouth daily for 14 days.   PRENATAL PO Take by mouth.         Total time spent taking care of this patient: 35 minutes  Signed: Garnette Mace, MD  02/02/2024, 10:22 PM

## 2024-02-02 NOTE — OB Triage Note (Signed)
 Pt Kristen Romero 28 y.o. presents to labor and delivery triage reporting that her baby is moving less today. Pt is a G1P0 at 106w3d . Pt denies signs and symptons consistent with rupture of membranes or active vaginal bleeding. Pt denies contractions  External FM and TOCO applied to non-tender abdomen and assessing. Initial FHR 135 . Vital signs obtained and within normal limits. Provider notified of pt.

## 2024-02-02 NOTE — OB Triage Note (Signed)
 Discharge instructions, labor precautions, and follow-up care reviewed with patient and significant other. All questions answered. Patient verbalized understanding. Discharged ambulatory off unit.

## 2024-02-05 ENCOUNTER — Encounter: Payer: Self-pay | Admitting: Obstetrics and Gynecology

## 2024-02-05 ENCOUNTER — Inpatient Hospital Stay
Admission: RE | Admit: 2024-02-05 | Discharge: 2024-02-07 | DRG: 807 | Disposition: A | Discharge status: Home / Self Care | Source: Ambulatory Visit | Attending: Obstetrics | Admitting: Obstetrics

## 2024-02-05 ENCOUNTER — Other Ambulatory Visit: Payer: Self-pay

## 2024-02-05 DIAGNOSIS — O99824 Streptococcus B carrier state complicating childbirth: Secondary | ICD-10-CM | POA: Diagnosis present

## 2024-02-05 DIAGNOSIS — Z833 Family history of diabetes mellitus: Secondary | ICD-10-CM | POA: Diagnosis not present

## 2024-02-05 DIAGNOSIS — O480 Post-term pregnancy: Principal | ICD-10-CM | POA: Diagnosis present

## 2024-02-05 DIAGNOSIS — Z7982 Long term (current) use of aspirin: Secondary | ICD-10-CM

## 2024-02-05 DIAGNOSIS — O368130 Decreased fetal movements, third trimester, not applicable or unspecified: Secondary | ICD-10-CM | POA: Diagnosis present

## 2024-02-05 DIAGNOSIS — Z3A40 40 weeks gestation of pregnancy: Secondary | ICD-10-CM

## 2024-02-05 DIAGNOSIS — O99214 Obesity complicating childbirth: Secondary | ICD-10-CM | POA: Diagnosis present

## 2024-02-05 DIAGNOSIS — Z3490 Encounter for supervision of normal pregnancy, unspecified, unspecified trimester: Principal | ICD-10-CM | POA: Diagnosis present

## 2024-02-05 LAB — CBC
HCT: 38.3 % (ref 36.0–46.0)
Hemoglobin: 12.9 g/dL (ref 12.0–15.0)
MCH: 28.6 pg (ref 26.0–34.0)
MCHC: 33.7 g/dL (ref 30.0–36.0)
MCV: 84.9 fL (ref 80.0–100.0)
Platelets: 252 K/uL (ref 150–400)
RBC: 4.51 MIL/uL (ref 3.87–5.11)
RDW: 14.5 % (ref 11.5–15.5)
WBC: 8.1 K/uL (ref 4.0–10.5)
nRBC: 0 % (ref 0.0–0.2)

## 2024-02-05 LAB — TYPE AND SCREEN
ABO/RH(D): O POS
Antibody Screen: NEGATIVE

## 2024-02-05 MED ORDER — DIPHENHYDRAMINE HCL 50 MG/ML IJ SOLN
INTRAMUSCULAR | Status: AC
  Administered 2024-02-05: 50 mg via INTRAVENOUS
  Filled 2024-02-05: qty 1

## 2024-02-05 MED ORDER — FENTANYL CITRATE (PF) 100 MCG/2ML IJ SOLN
50.0000 ug | INTRAMUSCULAR | Status: DC | PRN
  Administered 2024-02-05 (×2): 100 ug via INTRAVENOUS
  Filled 2024-02-05 (×2): qty 2

## 2024-02-05 MED ORDER — DIPHENHYDRAMINE HCL 50 MG/ML IJ SOLN: 50.0000 mg | Freq: Once | INTRAMUSCULAR | Status: AC

## 2024-02-05 MED ORDER — OXYTOCIN 10 UNIT/ML IJ SOLN
INTRAMUSCULAR | Status: DC
Start: 2024-02-05 — End: 2024-02-05
  Filled 2024-02-05: qty 2

## 2024-02-05 MED ORDER — OXYTOCIN-SODIUM CHLORIDE 30-0.9 UT/500ML-% IV SOLN
2.5000 [IU]/h | INTRAVENOUS | Status: DC
  Filled 2024-02-05: qty 500

## 2024-02-05 MED ORDER — LACTATED RINGERS IV SOLN: 500.0000 mL | INTRAVENOUS | Status: DC | PRN

## 2024-02-05 MED ORDER — MISOPROSTOL 200 MCG PO TABS
ORAL_TABLET | ORAL | Status: AC
  Filled 2024-02-05: qty 4

## 2024-02-05 MED ORDER — SODIUM CHLORIDE 0.9 % IV SOLN
5.0000 10*6.[IU] | Freq: Once | INTRAVENOUS | Status: AC
  Administered 2024-02-05: 5 10*6.[IU] via INTRAVENOUS
  Filled 2024-02-05: qty 5

## 2024-02-05 MED ORDER — PENICILLIN G POT IN DEXTROSE 60000 UNIT/ML IV SOLN
3.0000 10*6.[IU] | INTRAVENOUS | Status: DC
  Administered 2024-02-05 – 2024-02-06 (×5): 3 10*6.[IU] via INTRAVENOUS
  Filled 2024-02-05 (×5): qty 50

## 2024-02-05 MED ORDER — CALCIUM CARBONATE ANTACID 500 MG PO CHEW
2.0000 | CHEWABLE_TABLET | Freq: Once | ORAL | Status: AC
  Administered 2024-02-05: 400 mg via ORAL

## 2024-02-05 MED ORDER — OXYTOCIN-SODIUM CHLORIDE 30-0.9 UT/500ML-% IV SOLN
1.0000 m[IU]/min | INTRAVENOUS | Status: DC
  Administered 2024-02-05: 2 m[IU]/min via INTRAVENOUS
  Filled 2024-02-05: qty 500

## 2024-02-05 MED ORDER — TERBUTALINE SULFATE 1 MG/ML IJ SOLN: 0.2500 mg | Freq: Once | INTRAMUSCULAR | Status: DC | PRN

## 2024-02-05 MED ORDER — MISOPROSTOL 25 MCG QUARTER TABLET
25.0000 ug | ORAL_TABLET | ORAL | Status: DC
  Administered 2024-02-05: 25 ug via VAGINAL
  Filled 2024-02-05 (×2): qty 1

## 2024-02-05 MED ORDER — CALCIUM CARBONATE ANTACID 500 MG PO CHEW
CHEWABLE_TABLET | ORAL | Status: AC
  Filled 2024-02-05: qty 2

## 2024-02-05 MED ORDER — LIDOCAINE HCL (PF) 1 % IJ SOLN
30.0000 mL | INTRAMUSCULAR | Status: DC | PRN
  Filled 2024-02-05: qty 30

## 2024-02-05 MED ORDER — ONDANSETRON HCL 4 MG/2ML IJ SOLN: 4.0000 mg | Freq: Four times a day (QID) | INTRAMUSCULAR | Status: DC | PRN

## 2024-02-05 MED ORDER — OXYTOCIN BOLUS FROM INFUSION
333.0000 mL | Freq: Once | INTRAVENOUS | Status: AC
  Administered 2024-02-06: 333 mL via INTRAVENOUS

## 2024-02-05 MED ORDER — LACTATED RINGERS IV SOLN: INTRAVENOUS | Status: DC

## 2024-02-05 MED ORDER — AMMONIA AROMATIC IN INHA
RESPIRATORY_TRACT | Status: AC
  Filled 2024-02-05: qty 10

## 2024-02-05 MED ORDER — ACETAMINOPHEN 325 MG PO TABS: 650.0000 mg | ORAL_TABLET | ORAL | Status: DC | PRN

## 2024-02-05 MED ORDER — SOD CITRATE-CITRIC ACID 500-334 MG/5ML PO SOLN: 30.0000 mL | ORAL | Status: DC | PRN

## 2024-02-05 MED ORDER — MISOPROSTOL 25 MCG QUARTER TABLET
25.0000 ug | ORAL_TABLET | ORAL | Status: DC
  Administered 2024-02-05: 25 ug via ORAL
  Filled 2024-02-05: qty 1

## 2024-02-05 NOTE — Progress Notes (Signed)
 L&D Note    Subjective:  Feeling pain with contractions and requesting pain medication  Objective:      02/05/2024    8:36 PM 02/05/2024    4:00 PM 02/05/2024   11:47 AM  Vitals with BMI  Systolic 117 111 888  Diastolic 58 68 48  Pulse 83 71 73     Gen: alert, cooperative, no distress FHR: Baseline: 140 bpm, Variability: moderate, Accels: Present, Decels: none Toco: regular, every 3 minutes SVE: Dilation: 5.5 Effacement (%): 80 Cervical Position: Middle Station: -1 Presentation: Vertex Exam by:: Aisha  Medications SCHEDULED MEDICATIONS   calcium  carbonate       calcium  carbonate  2 tablet Oral Once   diphenhydrAMINE   50 mg Intravenous Once   misoprostol   25 mcg Oral Q4H   And   misoprostol   25 mcg Vaginal Q4H   oxytocin  40 units in LR 1000 mL  333 mL Intravenous Once    MEDICATION INFUSIONS   lactated ringers      lactated ringers  125 mL/hr at 02/05/24 1804   oxytocin      oxytocin  18 milli-units/min (02/05/24 2238)   pencillin G potassium IV 3 Million Units (02/05/24 2156)    PRN MEDICATIONS  acetaminophen , calcium  carbonate, fentaNYL  (SUBLIMAZE ) injection, lactated ringers , lidocaine  (PF), ondansetron , sodium citrate-citric acid , terbutaline    Assessment & Plan:  28 y.o. G1P0 at [redacted]w[redacted]d admitted for Labor_induction_indication: Post dates. Due date 01/30/24 -GBS: positive -IP Antibiotics: abx: PCN x 3 -Membranes ruptured, clear fluid x 9 hours -Recheck:Evaluated by digital exam. -Preeclampsia:  labs stable -Pain: level of pain (1-10, 10 severe), 9/10 -Intervention: IV Pitocin  augmentation, increase Pitocin  rate, change maternal position, and anticipate vaginal delivery -Pe_uterus_labor: Pitocin  at 16 mu/min. and Adequate relaxation between contractions. -Analgesia: IVPM and position changes    Kristen Romero, CNM  02/05/2024 10:50 PM  [facility]

## 2024-02-05 NOTE — Progress Notes (Addendum)
 Labor Progress Note  Kristen Romero is a 28 y.o. G1P0 at [redacted]w[redacted]d by ultrasound admitted for induction of labor due to Post dates. Due date 01/30/24.  Subjective: feeling pain with contractions, declines pain medication  Objective: BP 111/68   Pulse 71   Temp 98 F (36.7 C) (Oral)   Resp 16   Ht 5' 2 (1.575 m)   Wt 108.9 kg   LMP 04/15/2023 (Exact Date)   BMI 43.90 kg/m    Fetal Assessment: FHT:  FHR: 145 bpm, variability: moderate,  accelerations:  Present,  decelerations:  Absent Category/reactivity:  Category I UC:   regular, every 2-5 minutes Labs: Lab Results  Component Value Date   WBC 8.1 02/05/2024   HGB 12.9 02/05/2024   HCT 38.3 02/05/2024   MCV 84.9 02/05/2024   PLT 252 02/05/2024      Current Vital Signs 24h Vital Sign Ranges  T 98 F (36.7 C) Temp  Avg: 98.1 F (36.7 C)  Min: 97.9 F (36.6 C)  Max: 98.4 F (36.9 C)  BP 111/68 BP  Min: 111/68  Max: 125/77  HR 71 Pulse  Avg: 76.3  Min: 71  Max: 85  RR 16 Resp  Avg: 16  Min: 16  Max: 16  SaO2     No data recorded        Assessment / Plan: A/P: 28 y.o. G1P0 female at [redacted]w[redacted]d with postdates induction  1.  Labor: early labor Labor Progressing normally and preeclampsia labs stable 2.  QTA:Nczmjoo assessment: Category I 3.  Group B Strep positive 4. Membranes ruptured, clear fluid x5 hours 5.  Pain: not receiving treatment  per patient request and level of pain (1-10, 10 severe), 7/10 6.  Recheck:Evaluated by digital exam. 7. Anticipate vaginal delivery. and Intervention: increase Pitocin  rate, change maternal position, and anticipate vaginal delivery  Bobbette Brunswick [facility] 02/05/2024 7:24 PM

## 2024-02-05 NOTE — Progress Notes (Signed)
 Brief Nutrition Note  RD received consult for diet education.   Case discussed with RN, who confirmed that pt is being induced today. RN agrees that it would be more appropriate to see pt after delivery.   Pt G1P0000; noted per outpatient notes, that this is is a high risk pregnancy. Pregravid wt 215#.   RD will assess pt after delivery. RN agreeable to plan.   Margery ORN, RD, LDN, CDCES Registered Dietitian III Certified Diabetes Care and Education Specialist If unable to reach this RD, please use RD Inpatient group chat on secure chat between hours of 8am-4 pm daily

## 2024-02-05 NOTE — H&P (Signed)
 OB History & Physical   History of Present Illness:   Chief Complaint: IOL for postdates  HPI:  Kristen Romero is a 28 y.o. G1P0 female at [redacted]w[redacted]d, Patient's last menstrual period was 04/15/2023 (exact date)., not consistent with US  at [redacted]w[redacted]d, with Estimated Date of Delivery: 01/30/24.  She presents to L&D for induction of labor due to Post dates. Due date 01/30/24.  Reports active fetal movement  Contractions: denies  LOF/SROM: denies Vaginal bleeding: denies  Factors complicating pregnancy:  Obesity GBS pos Elevated 1 hr gtt, normal 3 hour gtt SCH-resolved  Patient Active Problem List   Diagnosis Date Noted   Encounter for elective induction of labor 02/05/2024   Decreased fetal movement affecting management of pregnancy in third trimester 02/02/2024   Obesity affecting pregnancy, antepartum 09/06/2023   Subchorionic hemorrhage, antepartum 09/06/2023    Prenatal Transfer Tool  Maternal Diabetes: No Genetic Screening: Normal Maternal Ultrasounds/Referrals: Normal Fetal Ultrasounds or other Referrals:  None Maternal Substance Abuse:  No Significant Maternal Medications:  None Significant Maternal Lab Results: Group B Strep positive  Maternal Medical History:   Past Medical History:  Diagnosis Date   Anxiety     Past Surgical History:  Procedure Laterality Date   NO PAST SURGERIES      No Known Allergies  Prior to Admission medications   Medication Sig Start Date End Date Taking? Authorizing Provider  aspirin EC 81 MG tablet Take 81 mg by mouth daily. Swallow whole. Patient not taking: Reported on 02/02/2024    [provider]  magnesium oxide (MAG-OX) 400 (240 Mg) MG tablet Take 400 mg by mouth at bedtime.    [provider]  pantoprazole  (PROTONIX ) 40 MG tablet Take 1 tablet (40 mg total) by mouth daily for 14 days. 11/20/22 12/04/22  Arlander Charleston, MD  Prenatal Vit-Fe Fumarate-FA (PRENATAL PO) Take by mouth.    [provider]     Prenatal  care site:  [facility]  OB History  Gravida Para Term Preterm AB Living  1 0 0 0 0 0  SAB IAB Ectopic Multiple Live Births  0 0 0 0 0    # Outcome Date GA Lbr Len/2nd Weight Sex Type Anes PTL Lv  1 Current              Social History: She  reports that she has never smoked. She has never used smokeless tobacco. She reports that she does not currently use alcohol. She reports that she does not use drugs.  Family History: family history includes Diabetes in her maternal grandfather.   Review of Systems: A full review of systems was performed and negative except as noted in the HPI.     Physical Exam:  Vital Signs: BP (!) 111/48   Pulse 73   Temp 97.9 F (36.6 C) (Oral)   Resp 16   Ht 5' 2 (1.575 m)   Wt 108.9 kg   LMP 04/15/2023 (Exact Date)   BMI 43.90 kg/m   General: no acute distress.  HEENT: normocephalic, atraumatic Heart: regular rate & rhythm Lungs: normal respiratory effort Abdomen: soft, gravid, non-tender;   Pelvic:   External: Normal external female genitalia  Cervix: Dilation: 1 / Effacement (%): 60 / Station: -1    Extremities: non-tender, symmetric, no edema bilaterally.  DTRs: +2  Neurologic: Alert & oriented x 3.    Results for orders placed or performed during the hospital encounter of 02/05/24 (from the past 24 hours)  CBC  Status: None   Collection Time: 02/05/24  9:53 AM  Result Value Ref Range   WBC 8.1 4.0 - 10.5 K/uL   RBC 4.51 3.87 - 5.11 MIL/uL   Hemoglobin 12.9 12.0 - 15.0 g/dL   HCT 61.6 63.9 - 53.9 %   MCV 84.9 80.0 - 100.0 fL   MCH 28.6 26.0 - 34.0 pg   MCHC 33.7 30.0 - 36.0 g/dL   RDW 85.4 88.4 - 84.4 %   Platelets 252 150 - 400 K/uL   nRBC 0.0 0.0 - 0.2 %  Type and screen     Status: None   Collection Time: 02/05/24  9:53 AM  Result Value Ref Range   ABO/RH(D) O POS    Antibody Screen NEG    Sample Expiration      02/08/2024,2359 Performed at [facility], [address] [place], [place]  [address]     Pertinent Results:  Prenatal Labs: Blood type/Rh O POS   Antibody screen Negative    Rubella Immune (12/10 0000)   Varicella Immune  RPR Nonreactive (12/10 0000)   HBsAg Neg   Hep C NR   HIV Non-reactive (12/10 0000)   GC neg  Chlamydia neg  Genetic screening cfDNA negative   1 hour GTT 148  3 hour GTT 157, 114, 106  GBS Positive/-- (06/02 0000)    FHT:  FHR: 130 bpm, variability: moderate,  accelerations:  Present,  decelerations:  Absent Category/reactivity:  Category I UC:   occasional    Cephalic by Leopolds and SVE   No results found.  Assessment:  Leisha Mccarey is a 28 y.o. G1P0 female at [redacted]w[redacted]d with IOL for postdates, obesity and GBS negative.   Plan:  1. Admit to Labor & Delivery - consents reviewed and obtained - .Dr. CHARM Dinsmore MD notified of admission and plan of care   2. Fetal Well being  - Fetal Tracing: Category 1 - Group B Streptococcus ppx  indicated: GBS positive - Presentation: Cephalic confirmed by sve   3. Routine OB: - Prenatal labs reviewed, as above - Rh positive - CBC, T&S, RPR on admit - Clear liquid diet , continuous IV fluids  4. Induction of labor  - Contractions monitored with external toco - Pelvis adequate for trial of labor  - Plan for induction with misoprostol  and cervical balloon  - Augmentation with oxytocin  and AROM as appropriate  - Plan for  continuous fetal monitoring - Maternal pain control as desired; planning IVPM - Anticipate vaginal delivery  5. Cook Cervical Balloon placed with both balloons inflated with 80 cc LR -oral/vaginal Cytotec  placed -IV PCN initial dose given with placement of Cervical balloon.  6. Post Partum Planning: - Infant feeding: breast feeding - Contraception: Contraceptives: None - Tdap vaccine: Given prenatally - Flu vaccine: declined -RSV vaccine:not in season  Saifan Rayford, CNM 02/05/24 12:42 PM  Bobbette Brunswick, CNM Certified Nurse Midwife [facility]

## 2024-02-06 ENCOUNTER — Inpatient Hospital Stay: Admitting: Anesthesiology

## 2024-02-06 ENCOUNTER — Encounter: Payer: Self-pay | Admitting: Obstetrics and Gynecology

## 2024-02-06 LAB — RPR: RPR Ser Ql: NONREACTIVE

## 2024-02-06 MED ORDER — DIPHENHYDRAMINE HCL 50 MG/ML IJ SOLN: 12.5000 mg | INTRAMUSCULAR | Status: DC | PRN

## 2024-02-06 MED ORDER — FENTANYL-BUPIVACAINE-NACL 0.5-0.125-0.9 MG/250ML-% EP SOLN
12.0000 mL/h | EPIDURAL | Status: DC | PRN
  Administered 2024-02-06: 12 mL/h via EPIDURAL

## 2024-02-06 MED ORDER — BUPIVACAINE HCL (PF) 0.25 % IJ SOLN
INTRAMUSCULAR | Status: DC | PRN
  Administered 2024-02-06 (×2): 4 mL via EPIDURAL

## 2024-02-06 MED ORDER — LACTATED RINGERS IV SOLN
500.0000 mL | Freq: Once | INTRAVENOUS | Status: AC
  Administered 2024-02-06: 500 mL via INTRAVENOUS

## 2024-02-06 MED ORDER — EPHEDRINE 5 MG/ML INJ: 10.0000 mg | INTRAVENOUS | Status: DC | PRN

## 2024-02-06 MED ORDER — LIDOCAINE-EPINEPHRINE (PF) 1.5 %-1:200000 IJ SOLN
INTRAMUSCULAR | Status: DC | PRN
  Administered 2024-02-06: 3 mL via EPIDURAL

## 2024-02-06 MED ORDER — ONDANSETRON HCL 4 MG/2ML IJ SOLN: 4.0000 mg | INTRAMUSCULAR | Status: DC | PRN

## 2024-02-06 MED ORDER — SIMETHICONE 80 MG PO CHEW: 80.0000 mg | CHEWABLE_TABLET | ORAL | Status: DC | PRN

## 2024-02-06 MED ORDER — SENNOSIDES-DOCUSATE SODIUM 8.6-50 MG PO TABS
2.0000 | ORAL_TABLET | ORAL | Status: DC
  Administered 2024-02-06 – 2024-02-07 (×2): 2 via ORAL
  Filled 2024-02-06 (×2): qty 2

## 2024-02-06 MED ORDER — PHENYLEPHRINE 80 MCG/ML (10ML) SYRINGE FOR IV PUSH (FOR BLOOD PRESSURE SUPPORT): 80.0000 ug | PREFILLED_SYRINGE | INTRAVENOUS | Status: DC | PRN

## 2024-02-06 MED ORDER — BENZOCAINE-MENTHOL 20-0.5 % EX AERO
1.0000 | INHALATION_SPRAY | CUTANEOUS | Status: DC | PRN
  Filled 2024-02-06: qty 56

## 2024-02-06 MED ORDER — DIBUCAINE (PERIANAL) 1 % EX OINT
1.0000 | TOPICAL_OINTMENT | CUTANEOUS | Status: DC | PRN
  Administered 2024-02-06: 1 via RECTAL
  Filled 2024-02-06 (×2): qty 28

## 2024-02-06 MED ORDER — TRANEXAMIC ACID-NACL 1000-0.7 MG/100ML-% IV SOLN
INTRAVENOUS | Status: AC
  Filled 2024-02-06: qty 100

## 2024-02-06 MED ORDER — PRENATAL MULTIVITAMIN CH
1.0000 | ORAL_TABLET | Freq: Every day | ORAL | Status: DC
  Administered 2024-02-06 – 2024-02-07 (×2): 1 via ORAL
  Filled 2024-02-06 (×3): qty 1

## 2024-02-06 MED ORDER — OXYCODONE HCL 5 MG PO TABS: 5.0000 mg | ORAL_TABLET | ORAL | Status: DC | PRN

## 2024-02-06 MED ORDER — TETANUS-DIPHTH-ACELL PERTUSSIS 5-2.5-18.5 LF-MCG/0.5 IM SUSY: 0.5000 mL | PREFILLED_SYRINGE | Freq: Once | INTRAMUSCULAR | Status: DC

## 2024-02-06 MED ORDER — ONDANSETRON HCL 4 MG PO TABS: 4.0000 mg | ORAL_TABLET | ORAL | Status: DC | PRN

## 2024-02-06 MED ORDER — COCONUT OIL OIL
1.0000 | TOPICAL_OIL | Status: DC | PRN
  Filled 2024-02-06: qty 7.5

## 2024-02-06 MED ORDER — FLEET ENEMA RE ENEM: 1.0000 | ENEMA | Freq: Every day | RECTAL | Status: DC | PRN

## 2024-02-06 MED ORDER — BISACODYL 10 MG RE SUPP: 10.0000 mg | Freq: Every day | RECTAL | Status: DC | PRN

## 2024-02-06 MED ORDER — LIDOCAINE HCL (PF) 1 % IJ SOLN
INTRAMUSCULAR | Status: DC | PRN
  Administered 2024-02-06: 3 mL via SUBCUTANEOUS

## 2024-02-06 MED ORDER — ACETAMINOPHEN 325 MG PO TABS
650.0000 mg | ORAL_TABLET | ORAL | Status: DC | PRN
  Administered 2024-02-07 (×2): 650 mg via ORAL
  Filled 2024-02-06 (×2): qty 2

## 2024-02-06 MED ORDER — FENTANYL-BUPIVACAINE-NACL 0.5-0.125-0.9 MG/250ML-% EP SOLN
EPIDURAL | Status: AC
  Filled 2024-02-06: qty 250

## 2024-02-06 MED ORDER — WITCH HAZEL-GLYCERIN EX PADS
1.0000 | MEDICATED_PAD | CUTANEOUS | Status: DC | PRN
  Filled 2024-02-06: qty 100

## 2024-02-06 MED ORDER — ZOLPIDEM TARTRATE 5 MG PO TABS: 5.0000 mg | ORAL_TABLET | Freq: Every evening | ORAL | Status: DC | PRN

## 2024-02-06 MED ORDER — IBUPROFEN 600 MG PO TABS
600.0000 mg | ORAL_TABLET | Freq: Four times a day (QID) | ORAL | Status: DC
  Administered 2024-02-06 – 2024-02-07 (×4): 600 mg via ORAL
  Filled 2024-02-06 (×4): qty 1

## 2024-02-06 MED ORDER — DIPHENHYDRAMINE HCL 25 MG PO CAPS: 25.0000 mg | ORAL_CAPSULE | Freq: Four times a day (QID) | ORAL | Status: DC | PRN

## 2024-02-06 MED ORDER — TRANEXAMIC ACID-NACL 1000-0.7 MG/100ML-% IV SOLN
1000.0000 mg | INTRAVENOUS | Status: AC
  Administered 2024-02-06: 1000 mg via INTRAVENOUS

## 2024-02-06 NOTE — Progress Notes (Signed)
 Labor Progress Note  Kristen Romero is a 28 y.o. G1P0 at [redacted]w[redacted]d by ultrasound admitted for induction of labor due to Post dates. Due date 01/30/24.  Subjective: resting comfortably with epidural  Objective: BP (!) 108/58   Pulse 76   Temp 98.2 F (36.8 C) (Oral)   Resp 20   Ht 5' 2 (1.575 m)   Wt 108.9 kg   LMP 04/15/2023 (Exact Date)   SpO2 97%   BMI 43.90 kg/m    Fetal Assessment: FHT:  FHR: 135 bpm, variability: moderate,  accelerations:  Present,  decelerations:  Absent Category/reactivity:  Category I IUPC:   regular, every 2  minutes Labs: Lab Results  Component Value Date   WBC 8.1 02/05/2024   HGB 12.9 02/05/2024   HCT 38.3 02/05/2024   MCV 84.9 02/05/2024   PLT 252 02/05/2024      Current Vital Signs 24h Vital Sign Ranges  T 98.2 F (36.8 C) Temp  Avg: 98.1 F (36.7 C)  Min: 97.9 F (36.6 C)  Max: 98.4 F (36.9 C)  BP (!) 108/58 BP  Min: 104/56  Max: 125/77  HR 76 Pulse  Avg: 83.5  Min: 71  Max: 102  RR 20 Resp  Avg: 17.3  Min: 16  Max: 20  SaO2 97 %   SpO2  Avg: 97.1 %  Min: 96 %  Max: 98 %        Assessment / Plan: A/P: 28 y.o. G1P0 female at [redacted]w[redacted]d with postdates  1.  Labor: Early latent labor. and Adequate uterine activity - intensity and frequency. preeclampsia labs stable and pain controlled  Epidural 2.  QTA:Nczmjoo assessment: Category I 3.  Group B Strep positivePCN x 4 4. Membranes ruptured, clear fluid x 12 hours 5.  Pain: none 6.  Recheck:Evaluated by digital exam. 7. Intervention: change maternal position and place IUPC  Bobbette Brunswick [facility] 02/06/2024 2:11 AM

## 2024-02-06 NOTE — Anesthesia Procedure Notes (Signed)
 Epidural Patient location during procedure: OB Start time: 02/06/2024 12:22 AM End time: 02/06/2024 12:29 AM  Staffing Anesthesiologist: Bubba Vanbenschoten, Fairy POUR, MD Performed: anesthesiologist   Preanesthetic Checklist Completed: patient identified, IV checked, site marked, risks and benefits discussed, surgical consent, monitors and equipment checked, pre-op evaluation and timeout performed  Epidural Patient position: sitting Prep: ChloraPrep Patient monitoring: heart rate, continuous pulse ox and blood pressure Approach: midline Location: L3-L4 Injection technique: LOR saline  Needle:  Needle type: Tuohy  Needle gauge: 17 G Needle length: 9 cm and 9 Needle insertion depth: 7 cm Catheter type: closed end flexible Catheter size: 19 Gauge Catheter at skin depth: 12 cm Test dose: negative and 1.5% lidocaine  with Epi 1:200 K  Assessment Sensory level: T10 Events: blood not aspirated, no cerebrospinal fluid, injection not painful, no injection resistance, no paresthesia and negative IV test  Additional Notes 1 attempt Pt. Evaluated and documentation done after procedure finished. Patient identified. Risks/Benefits/Options discussed with patient including but not limited to bleeding, infection, nerve damage, paralysis, failed block, incomplete pain control, headache, blood pressure changes, nausea, vomiting, reactions to medication both or allergic, itching and postpartum back pain. Confirmed with bedside nurse the patient's most recent platelet count. Confirmed with patient that they are not currently taking any anticoagulation, have any bleeding history or any family history of bleeding disorders. Patient expressed understanding and wished to proceed. All questions were answered. Sterile technique was used throughout the entire procedure. Please see nursing notes for vital signs. Test dose was given through epidural catheter and negative prior to continuing to dose epidural or start  infusion. Warning signs of high block given to the patient including shortness of breath, tingling/numbness in hands, complete motor block, or any concerning symptoms with instructions to call for help. Patient was given instructions on fall risk and not to get out of bed. All questions and concerns addressed with instructions to call with any issues or inadequate analgesia.    Patient tolerated the insertion well without immediate complications.Reason for block:procedure for pain

## 2024-02-06 NOTE — Discharge Summary (Signed)
 Postpartum Discharge Summary  Patient Name: Kristen Romero DOB: 01-21-1996 MRN: [id number]  Date of admission: 02/05/2024 Delivery date:02/06/2024 Delivering provider: DICKERSON, FELICIA Date of discharge: 02/07/2024  Primary OB: [facility] OFE:Ejupzwu'd last menstrual period was 04/15/2023 (exact date). EDC Estimated Date of Delivery: 01/30/24 Gestational Age at Delivery: [redacted]w[redacted]d   Admitting diagnosis: Encounter for elective induction of labor [Z34.90] Intrauterine pregnancy: [redacted]w[redacted]d     Secondary diagnosis:   Principal Problem:   Encounter for elective induction of labor  Discharge Diagnosis: Term Pregnancy Delivered      Hospital course: Induction of Labor With Vaginal Delivery   28 y.o. yo G1P1001 at [redacted]w[redacted]d was admitted to the hospital 02/05/2024 for induction of labor.  Indication for induction: Postdates.  Patient had an uncomplicated labor course. Membrane Rupture Time/Date: 1:30 PM,02/05/2024  Delivery Method:Vaginal, Spontaneous Operative Delivery:N/A Episiotomy: None Lacerations:  2nd degree;Vaginal;Labial Details of delivery can be found in separate delivery note.  Patient had a postpartum course without complication. Patient is discharged home 02/07/24.  Newborn Data: Birth date:02/06/2024 Birth time:7:07 AM Gender:FemaleKhelani Living status:Living Apgars:8 ,9  Weight:2920 g                                            Post partum procedures:none Induction:: Pitocin , Cytotec , and IP Foley Complications: None Delivery Type: spontaneous vaginal delivery Anesthesia: IV narcotics, epidural anesthesia Placenta: spontaneous To Pathology: No   Prenatal Labs:   Blood type/Rh O POS   Antibody screen Negative    Rubella Immune (12/10 0000)   Varicella Immune  RPR Nonreactive (12/10 0000)   HBsAg Neg   Hep C NR   HIV Non-reactive (12/10 0000)   GC neg  Chlamydia neg  Genetic screening cfDNA negative   1 hour GTT 148  3 hour GTT 157, 114, 106  GBS Positive/--  (06/02 0000)      Magnesium Sulfate received: No BMZ received: No Rhophylac:was not indicated MMR: was not indicated Varivax vaccine given: was not indicated - Tdap vaccine: Given prenatally - Flu vaccine: declined -RSV vaccine:not in season  Transfusion:No  Physical exam  Vitals:   02/06/24 1700 02/06/24 2007 02/07/24 0017 02/07/24 0818  BP: 110/73 107/72 (!) 94/58 105/62  Pulse: 96 95 82 77  Resp: 16 18 20 20   Temp: 98.1 F (36.7 C) 98.3 F (36.8 C) 98.3 F (36.8 C) 98.7 F (37.1 C)  TempSrc: Oral Oral Oral Oral  SpO2: 99% 95% 96% 99%  Weight:      Height:       General: alert, cooperative, and no distress Lochia: appropriate Uterine Fundus: firm Perineum: minimal edema/repair well approximated DVT Evaluation: No evidence of DVT seen on physical exam.  Labs: Lab Results  Component Value Date   WBC 13.8 (H) 02/07/2024   HGB 9.8 (L) 02/07/2024   HCT 30.4 (L) 02/07/2024   MCV 86.4 02/07/2024   PLT 200 02/07/2024      Latest Ref Rng & Units 06/21/2023    7:13 PM  CMP  Glucose 70 - 99 mg/dL 98   BUN 6 - 20 mg/dL 10   Creatinine 9.55 - 1.00 mg/dL 9.36   Sodium 864 - 854 mmol/L 135   Potassium 3.5 - 5.1 mmol/L 3.7   Chloride 98 - 111 mmol/L 102   CO2 22 - 32 mmol/L 23   Calcium  8.9 - 10.3 mg/dL 8.8   Total Protein  6.5 - 8.1 g/dL 7.8   Total Bilirubin <8.7 mg/dL 0.8   Alkaline Phos 38 - 126 U/L 48   AST 15 - 41 U/L 21   ALT 0 - 44 U/L 20    Edinburgh Score:    02/06/2024    8:19 PM  Edinburgh Postnatal Depression Scale Screening Tool  I have been able to laugh and see the funny side of things. 0  I have looked forward with enjoyment to things. 0  I have blamed myself unnecessarily when things went wrong. 0  I have been anxious or worried for no good reason. 0  I have felt scared or panicky for no good reason. 0  Things have been getting on top of me. 1  I have been so unhappy that I have had difficulty sleeping. 0  I have felt sad or miserable. 0  I  have been so unhappy that I have been crying. 0  The thought of harming myself has occurred to me. 0  Edinburgh Postnatal Depression Scale Total 1    Risk assessment for postpartum VTE and prophylactic treatment: Very high risk factors: None High risk factors: BMI 40-50 kg/m2 Moderate risk factors: None  Postpartum VTE prophylaxis with LMWH not indicated  After visit meds:  Allergies as of 02/07/2024   No Known Allergies      Medication List     STOP taking these medications    aspirin EC 81 MG tablet   pantoprazole  40 MG tablet Commonly known as: Protonix        TAKE these medications    acetaminophen  325 MG tablet Commonly known as: Tylenol  Take 2 tablets (650 mg total) by mouth every 6 (six) hours as needed for mild pain (pain score 1-3) or fever (for pain scale < 4).   benzocaine -Menthol  20-0.5 % Aero Commonly known as: DERMOPLAST Apply 1 Application topically as needed for irritation (perineal discomfort).   ibuprofen  600 MG tablet Commonly known as: ADVIL  Take 1 tablet (600 mg total) by mouth every 6 (six) hours as needed for fever, mild pain (pain score 1-3) or cramping.   magnesium oxide 400 (240 Mg) MG tablet Commonly known as: MAG-OX Take 400 mg by mouth at bedtime.   PRENATAL PO Take by mouth.   senna-docusate 8.6-50 MG tablet Commonly known as: Senokot-S Take 2 tablets by mouth at bedtime as needed for mild constipation.   simethicone  80 MG chewable tablet Commonly known as: MYLICON Chew 1 tablet (80 mg total) by mouth 4 (four) times daily as needed for flatulence.   witch hazel-glycerin  pad Commonly known as: TUCKS Apply 1 Application topically as needed for hemorrhoids (for pain).       Discharge home in stable condition Infant Feeding: Breast Infant Disposition:home with mother Discharge instruction: per After Visit Summary and Postpartum booklet. Activity: Advance as tolerated. Pelvic rest for 6 weeks.  Diet: routine  diet Anticipated Birth Control:  Contraceptives: None Postpartum Appointment:6 weeks Additional Postpartum F/U: none Future Appointments:No future appointments. Follow up Visit:  Follow-up Information     Aisha Heller, CNM Follow up in 6 week(s).   Specialty: Obstetrics Why: pp visit Contact information: [address] [place] [address] [phone]                 Plan:  Kristen Romero was discharged to home in good condition. Follow-up appointment as directed.    Signed:  Edsel Charlies Blush, CNM 02/07/2024 9:35 AM

## 2024-02-06 NOTE — Lactation Note (Signed)
 This note was copied from a baby's chart. Lactation Consultation Note  Patient Name: Kristen Romero Date: 02/06/2024 Age:28 hours Reason for consult: Follow-up assessment During first LC assessment of the evening at 2115, infant attempted breastfeeding but could not sustain latch in football or cradle hold with LC assistance with latch. Infant also had two emesis episodes and was thrusting tongue and gagging with bottle feeding and syringe feeding attempts. Paused breast and bottle attempts and mom used DEBP for 15 minutes obtaining drops from left side.  RN notified of feeding attempts and plan for mom to continue bottle feeding attempts periodically and plan to pump every 3 hours if baby did not breastfeed.   Father of baby then notified RN that baby latched and fed at each breast at 2235. LC visited room and baby was showing hunger cues recognized by mom who then put baby to left breast in football hold. Baby then opened mouth with wide gape, latched with lips flanged, sucked rhythmically, and had an occasional audible swallow heard by [facility] and family. Some stimulation need to keep baby awake and some suction lost with repeated relatching necessary. Cluster feeding discussed with family.   Maternal Data    Feeding Mother's Current Feeding Choice: Breast Milk and Formula Nipple Type: Slow - flow  LATCH Score Latch: Repeated attempts needed to sustain latch, nipple held in mouth throughout feeding, stimulation needed to elicit sucking reflex. (off and on occasionally)  Audible Swallowing: A few with stimulation  Type of Nipple: Everted at rest and after stimulation  Comfort (Breast/Nipple): Soft / non-tender  Hold (Positioning): No assistance needed to correctly position infant at breast.  LATCH Score: 8   Lactation Tools Discussed/Used    Interventions Interventions: Support pillows;Education (reviewed cluster feeding)   Discharge Discharge Education: Warning signs  for feeding baby Pump: DEBP  Consult Status      Kristen Romero 02/06/2024, 11:40 PM

## 2024-02-06 NOTE — Anesthesia Preprocedure Evaluation (Signed)
 Anesthesia Evaluation  Patient identified by MRN, date of birth, ID band Patient awake    Reviewed: Allergy & Precautions, NPO status , Patient's Chart, lab work & pertinent test results  Airway Mallampati: III  TM Distance: <3 FB Neck ROM: full    Dental  (+) Chipped   Pulmonary neg pulmonary ROS   Pulmonary exam normal        Cardiovascular Exercise Tolerance: Good (-) hypertensionnegative cardio ROS Normal cardiovascular exam     Neuro/Psych    GI/Hepatic negative GI ROS,,,  Endo/Other    Renal/GU   negative genitourinary   Musculoskeletal   Abdominal   Peds  Hematology negative hematology ROS (+)   Anesthesia Other Findings Past Medical History: No date: Anxiety  Past Surgical History: No date: NO PAST SURGERIES  BMI    Body Mass Index: 43.90 kg/m      Reproductive/Obstetrics (+) Pregnancy                              Anesthesia Physical Anesthesia Plan  ASA: 3  Anesthesia Plan: Epidural   Post-op Pain Management:    Induction:   PONV Risk Score and Plan:   Airway Management Planned: Natural Airway  Additional Equipment:   Intra-op Plan:   Post-operative Plan:   Informed Consent: I have reviewed the patients History and Physical, chart, labs and discussed the procedure including the risks, benefits and alternatives for the proposed anesthesia with the patient or authorized representative who has indicated his/her understanding and acceptance.     Dental Advisory Given  Plan Discussed with: Anesthesiologist  Anesthesia Plan Comments: (Patient reports no bleeding problems and no anticoagulant use.   Patient consented for risks of anesthesia including but not limited to:  - adverse reactions to medications - risk of bleeding, infection and or nerve damage from epidural that could lead to paralysis - risk of headache or failed epidural - nerve damage due  to positioning - that if epidural is used for C-section that there is a chance of epidural failure requiring spinal placement or conversion to GA - Damage to heart, brain, lungs, other parts of body or loss of life  Patient voiced understanding and assent.)        Anesthesia Quick Evaluation

## 2024-02-06 NOTE — Lactation Note (Signed)
 This note was copied from a baby's chart. Lactation Consultation Note  Patient Name: Girl Laelani Vasko Unijb'd Date: 02/06/2024 Age:29 hours Reason for consult: Initial assessment;Primapara;Term;Breastfeeding assistance   Maternal Data Has patient been taught Hand Expression?: Yes Does the patient have breastfeeding experience prior to this delivery?: No  Initial assessment w/ a P1 patient and a 9hr old baby girl.  This was a SVD.  Patient w/ hx of obesity, not other factors affecting lactation identified at this time.  Patient stated that she did not have a pump but is interested in receiving a hand pump before discharging.  LC informed mom to reach out to insurance to also get a pump through them.  Feeding Mother's Current Feeding Choice: Breast Milk  LC attempted to help infant at the breast by arousing infant for a feeding. Prior to entrance mom attempted and mom stated infant was very spitty and would not feed.   Infant was placed on the rt breast in football hold.  Infant is constantly sticking out her tongue but needs assistance when at the breast.  Several attempts were tried and infant gives about 1 or 2 sucks and then starts to suck on her tongue.  LC attempted to do some suck training but infant would not suck.    Mom requested formula.  LC provided education on all supplementation options.  LC attempted to encourage a feeding with a syringe first and baby was able to taken in 1ml of formula.    Interventions Interventions: Breast feeding basics reviewed;Assisted with latch;Skin to skin;Hand express;Breast compression;Support pillows;Position options;Education  LC provided education on the following;  milk production expectations, hunger cues, day 1/2 wet/dirty diapers, hand expression, cluster feeding, benefits of STS and arousing infant for a feeding.  Lactation informed patient of feeding infant at least 8 or more times w/in a 24hr period but not exceeding 3hrs. Patient  verbalized understanding.   Discharge Pump: Advised to call insurance company [facility] Program: Yes  Consult Status Consult Status: Follow-up Follow-up type: In-patient    Miroslav Gin S Dyann Goodspeed 02/06/2024, 4:20 PM

## 2024-02-07 LAB — CBC
HCT: 30.4 % — ABNORMAL LOW (ref 36.0–46.0)
Hemoglobin: 9.8 g/dL — ABNORMAL LOW (ref 12.0–15.0)
MCH: 27.8 pg (ref 26.0–34.0)
MCHC: 32.2 g/dL (ref 30.0–36.0)
MCV: 86.4 fL (ref 80.0–100.0)
Platelets: 200 K/uL (ref 150–400)
RBC: 3.52 MIL/uL — ABNORMAL LOW (ref 3.87–5.11)
RDW: 15 % (ref 11.5–15.5)
WBC: 13.8 K/uL — ABNORMAL HIGH (ref 4.0–10.5)
nRBC: 0 % (ref 0.0–0.2)

## 2024-02-07 MED ORDER — WITCH HAZEL-GLYCERIN EX PADS: 1.0000 | MEDICATED_PAD | CUTANEOUS | 0 refills | Status: AC | PRN

## 2024-02-07 MED ORDER — BENZOCAINE-MENTHOL 20-0.5 % EX AERO: 1.0000 | INHALATION_SPRAY | CUTANEOUS | 0 refills | Status: AC | PRN

## 2024-02-07 MED ORDER — SIMETHICONE 80 MG PO CHEW: 80.0000 mg | CHEWABLE_TABLET | Freq: Four times a day (QID) | ORAL | 0 refills | Status: AC | PRN

## 2024-02-07 MED ORDER — ACETAMINOPHEN 325 MG PO TABS: 650.0000 mg | ORAL_TABLET | Freq: Four times a day (QID) | ORAL | 0 refills | Status: AC | PRN

## 2024-02-07 MED ORDER — SENNOSIDES-DOCUSATE SODIUM 8.6-50 MG PO TABS: 2.0000 | ORAL_TABLET | Freq: Every evening | ORAL | 0 refills | Status: AC | PRN

## 2024-02-07 MED ORDER — IBUPROFEN 600 MG PO TABS: 600.0000 mg | ORAL_TABLET | Freq: Four times a day (QID) | ORAL | 0 refills | Status: AC | PRN

## 2024-02-07 NOTE — Plan of Care (Signed)
 Patient discharged home with family.  Discharge instructions, when to follow up, and prescriptions reviewed with patient.  Patient verbalized understanding. Patient will be escorted out by auxiliary.   Elyn Sharps, RN 02/07/24 @1208 

## 2024-02-07 NOTE — Lactation Note (Signed)
 This note was copied from a baby's chart. Lactation Consultation Note  Patient Name: Kristen Romero Date: 02/07/2024 Age:28 hours Reason for consult: Follow-up assessment;Term;Primapara   Maternal Data This is mom's 1st baby, SVD. Mom with history of obesity. On follow-up mom reports she does not have any additional questions at this time. Mom is breastfeeding and offering supplemental milk as needed if baby does not latch at a feeding. Mom is also pumping to encourage her milk to transition in.  Has patient been taught Hand Expression?: Yes Does the patient have breastfeeding experience prior to this delivery?: No  Feeding Mother's Current Feeding Choice: Breast Milk and Formula (Mom is supplementing for baby with inconsistent latch. Mom is pumping and obtaining drops of colostrum.)   Interventions  Breastfeeding basics, education, coconut oil  Discharge Discharge Education: Engorgement and breast care;Warning signs for feeding baby;Outpatient recommendation Pump: Manual;Advised to call insurance company (Per mom she plans to follow-up for electric pump for home use via insurance co.)  Consult Status Consult Status: Complete Date: 02/07/24 Follow-up type: In-patient  Update provided to care nurse.  Kristen Romero 02/07/2024, 11:42 AM

## 2024-02-07 NOTE — Anesthesia Postprocedure Evaluation (Signed)
 Anesthesia Post Note  Patient: Kristen Romero  Procedure(s) Performed: AN AD HOC LABOR EPIDURAL  Patient location during evaluation: Mother Baby Anesthesia Type: Epidural Level of consciousness: awake and alert Pain management: pain level controlled Vital Signs Assessment: post-procedure vital signs reviewed and stable Respiratory status: spontaneous breathing, nonlabored ventilation and respiratory function stable Cardiovascular status: stable Postop Assessment: no headache, no backache, epidural receding and able to ambulate Anesthetic complications: no   No notable events documented.   Last Vitals:  Vitals:   02/06/24 2007 02/07/24 0017  BP: 107/72 (!) 94/58  Pulse: 95 82  Resp: 18 20  Temp: 36.8 C 36.8 C  SpO2: 95% 96%    Last Pain:  Vitals:   02/07/24 0553  TempSrc:   PainSc: 4                  Gala [name]

## 2024-02-07 NOTE — Discharge Instructions (Signed)
